# Patient Record
Sex: Female | Born: 1970 | Race: White | Hispanic: No | Marital: Married | Smoking: Never smoker
Health system: Southern US, Community
[De-identification: ages and names within clinical notes are randomized; demographics above are authoritative.]

## PROBLEM LIST (undated history)

## (undated) DIAGNOSIS — I10 Essential (primary) hypertension: Secondary | ICD-10-CM

## (undated) DIAGNOSIS — K219 Gastro-esophageal reflux disease without esophagitis: Secondary | ICD-10-CM

## (undated) DIAGNOSIS — G43909 Migraine, unspecified, not intractable, without status migrainosus: Secondary | ICD-10-CM

## (undated) DIAGNOSIS — D219 Benign neoplasm of connective and other soft tissue, unspecified: Secondary | ICD-10-CM

## (undated) DIAGNOSIS — R5383 Other fatigue: Secondary | ICD-10-CM

## (undated) HISTORY — DX: Essential (primary) hypertension: I10

## (undated) HISTORY — DX: Other fatigue: R53.83

## (undated) HISTORY — DX: Migraine, unspecified, not intractable, without status migrainosus: G43.909

## (undated) HISTORY — DX: Gastro-esophageal reflux disease without esophagitis: K21.9

## (undated) HISTORY — DX: Benign neoplasm of connective and other soft tissue, unspecified: D21.9

## (undated) HISTORY — PX: TONSILLECTOMY AND ADENOIDECTOMY: SUR1326

---
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
## 1998-07-23 ENCOUNTER — Other Ambulatory Visit: Admission: RE | Admit: 1998-07-23 | Discharge: 1998-07-23 | Payer: Self-pay | Admitting: Obstetrics & Gynecology

## 1999-02-02 ENCOUNTER — Inpatient Hospital Stay (HOSPITAL_COMMUNITY): Admission: AD | Admit: 1999-02-02 | Discharge: 1999-02-04 | Payer: Self-pay | Admitting: Obstetrics and Gynecology

## 1999-07-14 ENCOUNTER — Other Ambulatory Visit: Admission: RE | Admit: 1999-07-14 | Discharge: 1999-07-14 | Payer: Self-pay | Admitting: Obstetrics and Gynecology

## 2001-08-16 ENCOUNTER — Other Ambulatory Visit: Admission: RE | Admit: 2001-08-16 | Discharge: 2001-08-16 | Payer: Self-pay | Admitting: Obstetrics and Gynecology

## 2002-08-30 ENCOUNTER — Other Ambulatory Visit: Admission: RE | Admit: 2002-08-30 | Discharge: 2002-08-30 | Payer: Self-pay | Admitting: Obstetrics and Gynecology

## 2003-09-02 ENCOUNTER — Other Ambulatory Visit: Admission: RE | Admit: 2003-09-02 | Discharge: 2003-09-02 | Payer: Self-pay | Admitting: *Deleted

## 2004-09-23 ENCOUNTER — Other Ambulatory Visit: Admission: RE | Admit: 2004-09-23 | Discharge: 2004-09-23 | Payer: Self-pay | Admitting: Obstetrics and Gynecology

## 2005-06-28 ENCOUNTER — Ambulatory Visit: Payer: Self-pay | Admitting: Gastroenterology

## 2005-09-22 ENCOUNTER — Ambulatory Visit: Payer: Self-pay | Admitting: Internal Medicine

## 2005-10-20 ENCOUNTER — Other Ambulatory Visit: Admission: RE | Admit: 2005-10-20 | Discharge: 2005-10-20 | Payer: Self-pay | Admitting: Obstetrics and Gynecology

## 2006-04-04 ENCOUNTER — Ambulatory Visit: Payer: Self-pay | Admitting: Internal Medicine

## 2006-05-27 ENCOUNTER — Ambulatory Visit: Payer: Self-pay | Admitting: Family Medicine

## 2006-10-26 ENCOUNTER — Other Ambulatory Visit: Admission: RE | Admit: 2006-10-26 | Discharge: 2006-10-26 | Payer: Self-pay | Admitting: Obstetrics and Gynecology

## 2006-11-01 ENCOUNTER — Encounter: Admission: RE | Admit: 2006-11-01 | Discharge: 2006-11-01 | Payer: Self-pay | Admitting: Obstetrics and Gynecology

## 2007-03-16 ENCOUNTER — Ambulatory Visit: Payer: Self-pay | Admitting: Family Medicine

## 2008-01-22 ENCOUNTER — Ambulatory Visit: Payer: Self-pay | Admitting: Family Medicine

## 2008-01-22 DIAGNOSIS — T50995A Adverse effect of other drugs, medicaments and biological substances, initial encounter: Secondary | ICD-10-CM | POA: Insufficient documentation

## 2008-01-23 ENCOUNTER — Encounter (INDEPENDENT_AMBULATORY_CARE_PROVIDER_SITE_OTHER): Payer: Self-pay | Admitting: Internal Medicine

## 2008-03-22 DIAGNOSIS — G43909 Migraine, unspecified, not intractable, without status migrainosus: Secondary | ICD-10-CM | POA: Insufficient documentation

## 2008-03-22 DIAGNOSIS — I10 Essential (primary) hypertension: Secondary | ICD-10-CM | POA: Insufficient documentation

## 2008-03-22 HISTORY — DX: Migraine, unspecified, not intractable, without status migrainosus: G43.909

## 2008-03-22 HISTORY — DX: Essential (primary) hypertension: I10

## 2010-01-19 LAB — PSA: PSA: NORMAL

## 2012-06-26 ENCOUNTER — Other Ambulatory Visit: Payer: Self-pay | Admitting: Obstetrics and Gynecology

## 2012-06-26 DIAGNOSIS — Z1231 Encounter for screening mammogram for malignant neoplasm of breast: Secondary | ICD-10-CM

## 2012-07-13 ENCOUNTER — Ambulatory Visit: Payer: Self-pay

## 2012-07-24 ENCOUNTER — Ambulatory Visit: Payer: Self-pay

## 2012-08-15 ENCOUNTER — Ambulatory Visit
Admission: RE | Admit: 2012-08-15 | Discharge: 2012-08-15 | Disposition: A | Discharge status: Home / Self Care | Payer: BC Managed Care – PPO | Source: Ambulatory Visit | Attending: Obstetrics and Gynecology | Admitting: Obstetrics and Gynecology

## 2012-08-15 DIAGNOSIS — Z1231 Encounter for screening mammogram for malignant neoplasm of breast: Secondary | ICD-10-CM

## 2012-09-18 ENCOUNTER — Encounter: Payer: Self-pay | Admitting: Obstetrics and Gynecology

## 2014-02-01 ENCOUNTER — Ambulatory Visit: Payer: Self-pay | Admitting: Family Medicine

## 2014-02-01 LAB — COMPREHENSIVE METABOLIC PANEL
ANION GAP: 4 — AB (ref 7–16)
AST: 24 U/L (ref 15–37)
Albumin: 3.5 g/dL (ref 3.4–5.0)
Alkaline Phosphatase: 89 U/L
BILIRUBIN TOTAL: 0.3 mg/dL (ref 0.2–1.0)
BUN: 13 mg/dL (ref 7–18)
CO2: 28 mmol/L (ref 21–32)
Calcium, Total: 8.9 mg/dL (ref 8.5–10.1)
Chloride: 106 mmol/L (ref 98–107)
Creatinine: 0.9 mg/dL (ref 0.60–1.30)
Glucose: 93 mg/dL (ref 65–99)
Osmolality: 275 (ref 275–301)
POTASSIUM: 3.5 mmol/L (ref 3.5–5.1)
SGPT (ALT): 22 U/L (ref 12–78)
Sodium: 138 mmol/L (ref 136–145)
TOTAL PROTEIN: 7.1 g/dL (ref 6.4–8.2)

## 2014-02-01 LAB — CBC WITH DIFFERENTIAL/PLATELET
Basophil #: 0.1 10*3/uL (ref 0.0–0.1)
Basophil %: 0.7 %
EOS PCT: 2.4 %
Eosinophil #: 0.2 10*3/uL (ref 0.0–0.7)
HCT: 38 % (ref 35.0–47.0)
HGB: 13 g/dL (ref 12.0–16.0)
LYMPHS ABS: 2.3 10*3/uL (ref 1.0–3.6)
Lymphocyte %: 28.2 %
MCH: 29.3 pg (ref 26.0–34.0)
MCHC: 34.3 g/dL (ref 32.0–36.0)
MCV: 86 fL (ref 80–100)
MONOS PCT: 6 %
Monocyte #: 0.5 x10 3/mm (ref 0.2–0.9)
Neutrophil #: 5 10*3/uL (ref 1.4–6.5)
Neutrophil %: 62.7 %
Platelet: 274 10*3/uL (ref 150–440)
RBC: 4.44 10*6/uL (ref 3.80–5.20)
RDW: 13.7 % (ref 11.5–14.5)
WBC: 8 10*3/uL (ref 3.6–11.0)

## 2014-06-06 LAB — LIPID PANEL
HDL: 55 mg/dL (ref 35–70)
LDL CALC: 104 mg/dL

## 2014-06-07 LAB — LIPID PANEL
Cholesterol: 176 mg/dL (ref 0–200)
Triglycerides: 86 mg/dL (ref 40–160)

## 2015-04-17 LAB — BASIC METABOLIC PANEL
BUN: 13 mg/dL (ref 4–21)
CREATININE: 0.8 mg/dL (ref 0.5–1.1)
Glucose: 93 mg/dL
Potassium: 3.8 mmol/L (ref 3.4–5.3)
SODIUM: 141 mmol/L (ref 137–147)

## 2015-04-17 LAB — HEPATIC FUNCTION PANEL
ALT: 16 U/L (ref 7–35)
AST: 17 U/L (ref 13–35)
Alkaline Phosphatase: 87 U/L (ref 25–125)
BILIRUBIN, TOTAL: 0.2 mg/dL

## 2015-04-17 LAB — CBC AND DIFFERENTIAL
HEMATOCRIT: 37 % (ref 36–46)
HEMOGLOBIN: 12.8 g/dL (ref 12.0–16.0)
NEUTROS ABS: 5 /uL
Platelets: 333 10*3/uL (ref 150–399)
WBC: 8.3 10*3/mL

## 2015-04-18 DIAGNOSIS — D219 Benign neoplasm of connective and other soft tissue, unspecified: Secondary | ICD-10-CM | POA: Insufficient documentation

## 2015-04-18 DIAGNOSIS — R5383 Other fatigue: Secondary | ICD-10-CM

## 2015-04-18 HISTORY — DX: Other fatigue: R53.83

## 2015-04-18 HISTORY — DX: Benign neoplasm of connective and other soft tissue, unspecified: D21.9

## 2015-05-20 ENCOUNTER — Ambulatory Visit (INDEPENDENT_AMBULATORY_CARE_PROVIDER_SITE_OTHER): Payer: BC Managed Care – PPO | Admitting: Physician Assistant

## 2015-05-20 ENCOUNTER — Encounter: Payer: Self-pay | Admitting: Physician Assistant

## 2015-05-20 VITALS — BP 118/82 | HR 80 | Temp 98.4°F | Resp 16 | Wt 206.6 lb

## 2015-05-20 DIAGNOSIS — I10 Essential (primary) hypertension: Secondary | ICD-10-CM | POA: Diagnosis not present

## 2015-05-20 MED ORDER — HYDROCHLOROTHIAZIDE 12.5 MG PO TABS: 12.5000 mg | ORAL_TABLET | Freq: Every day | ORAL | Status: DC

## 2015-05-20 NOTE — Progress Notes (Deleted)
Patient ID: Rachel Wells, female   DOB: Apr 12, 1971, 44 y.o.   MRN: [id number]   Hypertension, follow-up:   BP Readings from Last 3 Encounters:  05/20/15 118/82  04/17/15 142/92  01/22/08 135/99     She was last seen for hypertension 1 months ago.   BP at that visit was 142/90.  Management changes since that visit include added HCTZ 12.20m daily. She reports good compliance with treatment.  She is not having side effects.   She is not exercising.  She is not adherent to low salt diet.    Outside blood pressures are not being taken.  She is experiencing lower extremity edema.   Patient denies chest pain, chest pressure/discomfort, claudication, dyspnea, exertional chest pressure/discomfort, irregular heart beat, near-syncope, orthopnea, palpitations, paroxysmal nocturnal dyspnea, syncope and tachypnea.    Cardiovascular risk factors include hypertension, obesity (BMI >= 30 kg/m2) and sedentary lifestyle.   Use of agents associated with hypertension: none.    Weight trend: decreasing steadily Current diet: in general, a "healthy" diet

## 2015-05-20 NOTE — Patient Instructions (Signed)
Fat and Cholesterol Control Diet Fat and cholesterol levels in your blood and organs are influenced by your diet. High levels of fat and cholesterol may lead to diseases of the heart, small and large blood vessels, gallbladder, liver, and pancreas. CONTROLLING FAT AND CHOLESTEROL WITH DIET Although exercise and lifestyle factors are important, your diet is key. That is because certain foods are known to raise cholesterol and others to lower it. The goal is to balance foods for their effect on cholesterol and more importantly, to replace saturated and trans fat with other types of fat, such as monounsaturated fat, polyunsaturated fat, and omega-3 fatty acids. On average, a person should consume no more than 15 to 17 g of saturated fat daily. Saturated and trans fats are considered "bad" fats, and they will raise LDL cholesterol. Saturated fats are primarily found in animal products such as meats, butter, and cream. However, that does not mean you need to give up all your favorite foods. Today, there are good tasting, low-fat, low-cholesterol substitutes for most of the things you like to eat. Choose low-fat or nonfat alternatives. Choose round or loin cuts of red meat. These types of cuts are lowest in fat and cholesterol. Chicken (without the skin), fish, veal, and ground turkey breast are great choices. Eliminate fatty meats, such as hot dogs and salami. Even shellfish have little or no saturated fat. Have a 3 oz (85 g) portion when you eat lean meat, poultry, or fish. Trans fats are also called "partially hydrogenated oils." They are oils that have been scientifically manipulated so that they are solid at room temperature resulting in a longer shelf life and improved taste and texture of foods in which they are added. Trans fats are found in stick margarine, some tub margarines, cookies, crackers, and baked goods.  When baking and cooking, oils are a great substitute for butter. The monounsaturated oils are  especially beneficial since it is believed they lower LDL and raise HDL. The oils you should avoid entirely are saturated tropical oils, such as coconut and palm.  Remember to eat a lot from food groups that are naturally free of saturated and trans fat, including fish, fruit, vegetables, beans, grains (barley, rice, couscous, bulgur wheat), and pasta (without cream sauces).  IDENTIFYING FOODS THAT LOWER FAT AND CHOLESTEROL  Soluble fiber may lower your cholesterol. This type of fiber is found in fruits such as apples, vegetables such as broccoli, potatoes, and carrots, legumes such as beans, peas, and lentils, and grains such as barley. Foods fortified with plant sterols (phytosterol) may also lower cholesterol. You should eat at least 2 g per day of these foods for a cholesterol lowering effect.  Read package labels to identify low-saturated fats, trans fat free, and low-fat foods at the supermarket. Select cheeses that have only 2 to 3 g saturated fat per ounce. Use a heart-healthy tub margarine that is free of trans fats or partially hydrogenated oil. When buying baked goods (cookies, crackers), avoid partially hydrogenated oils. Breads and muffins should be made from whole grains (whole-wheat or whole oat flour, instead of "flour" or "enriched flour"). Buy non-creamy canned soups with reduced salt and no added fats.  FOOD PREPARATION TECHNIQUES  Never deep-fry. If you must fry, either stir-fry, which uses very little fat, or use non-stick cooking sprays. When possible, broil, bake, or roast meats, and steam vegetables. Instead of putting butter or margarine on vegetables, use lemon and herbs, applesauce, and cinnamon (for squash and sweet potatoes). Use nonfat   yogurt, salsa, and low-fat dressings for salads.  LOW-SATURATED FAT / LOW-FAT FOOD SUBSTITUTES Meats / Saturated Fat (g)  Avoid: Steak, marbled (3 oz/85 g) / 11 g  Choose: Steak, lean (3 oz/85 g) / 4 g  Avoid: Hamburger (3 oz/85 g) / 7  g  Choose: Hamburger, lean (3 oz/85 g) / 5 g  Avoid: Ham (3 oz/85 g) / 6 g  Choose: Ham, lean cut (3 oz/85 g) / 2.4 g  Avoid: Chicken, with skin, dark meat (3 oz/85 g) / 4 g  Choose: Chicken, skin removed, dark meat (3 oz/85 g) / 2 g  Avoid: Chicken, with skin, light meat (3 oz/85 g) / 2.5 g  Choose: Chicken, skin removed, light meat (3 oz/85 g) / 1 g Dairy / Saturated Fat (g)  Avoid: Whole milk (1 cup) / 5 g  Choose: Low-fat milk, 2% (1 cup) / 3 g  Choose: Low-fat milk, 1% (1 cup) / 1.5 g  Choose: Skim milk (1 cup) / 0.3 g  Avoid: Hard cheese (1 oz/28 g) / 6 g  Choose: Skim milk cheese (1 oz/28 g) / 2 to 3 g  Avoid: Cottage cheese, 4% fat (1 cup) / 6.5 g  Choose: Low-fat cottage cheese, 1% fat (1 cup) / 1.5 g  Avoid: Ice cream (1 cup) / 9 g  Choose: Sherbet (1 cup) / 2.5 g  Choose: Nonfat frozen yogurt (1 cup) / 0.3 g  Choose: Frozen fruit bar / trace  Avoid: Whipped cream (1 tbs) / 3.5 g  Choose: Nondairy whipped topping (1 tbs) / 1 g Condiments / Saturated Fat (g)  Avoid: Mayonnaise (1 tbs) / 2 g  Choose: Low-fat mayonnaise (1 tbs) / 1 g  Avoid: Butter (1 tbs) / 7 g  Choose: Extra light margarine (1 tbs) / 1 g  Avoid: Coconut oil (1 tbs) / 11.8 g  Choose: Olive oil (1 tbs) / 1.8 g  Choose: Corn oil (1 tbs) / 1.7 g  Choose: Safflower oil (1 tbs) / 1.2 g  Choose: Sunflower oil (1 tbs) / 1.4 g  Choose: Soybean oil (1 tbs) / 2.4 g  Choose: Canola oil (1 tbs) / 1 g Document Released: 11/29/2005 Document Revised: 03/26/2013 Document Reviewed: 02/27/2014 [employer] Patient Information 2015 [place], [place]. This information is not intended to replace advice given to you by your health care provider. Make sure you discuss any questions you have with your health care provider.  American Heart Association (AHA) Exercise Recommendation  Being physically active is important to prevent heart disease and stroke, the nation's No. 1and No. 5killers. To improve  overall cardiovascular health, we suggest at least 150 minutes per week of moderate exercise or 75 minutes per week of vigorous exercise (or a combination of moderate and vigorous activity). Thirty minutes a day, five times a week is an easy goal to remember. You will also experience benefits even if you divide your time into two or three segments of 10 to 15 minutes per day.  For people who would benefit from lowering their blood pressure or cholesterol, we recommend 40 minutes of aerobic exercise of moderate to vigorous intensity three to four times a week to lower the risk for heart attack and stroke.  Physical activity is anything that makes you move your body and burn calories.  This includes things like climbing stairs or playing sports. Aerobic exercises benefit your heart, and include walking, jogging, swimming or biking. Strength and stretching exercises are best for overall stamina  and flexibility.  The simplest, positive change you can make to effectively improve your heart health is to start walking. It's enjoyable, free, easy, social and great exercise. A walking program is flexible and boasts high success rates because people can stick with it. It's easy for walking to become a regular and satisfying part of life.   For Overall Cardiovascular Health:  At least 30 minutes of moderate-intensity aerobic activity at least 5 days per week for a total of 150  OR   At least 25 minutes of vigorous aerobic activity at least 3 days per week for a total of 75 minutes; or a combination of moderate- and vigorous-intensity aerobic activity  AND   Moderate- to high-intensity muscle-strengthening activity at least 2 days per week for additional health benefits.  For Lowering Blood Pressure and Cholesterol  An average 40 minutes of moderate- to vigorous-intensity aerobic activity 3 or 4 times per week  What if I can't make it to the time goal? Something is always better than nothing! And  everyone has to start somewhere. Even if you've been sedentary for years, today is the day you can begin to make healthy changes in your life. If you don't think you'll make it for 30 or 40 minutes, set a reachable goal for today. You can work up toward your overall goal by increasing your time as you get stronger. Don't let all-or-nothing thinking rob you of doing what you can every day.  Source:http://www.heart.org   Hypertension Hypertension, commonly called high blood pressure, is when the force of blood pumping through your arteries is too strong. Your arteries are the blood vessels that carry blood from your heart throughout your body. A blood pressure reading consists of a higher number over a lower number, such as 110/72. The higher number (systolic) is the pressure inside your arteries when your heart pumps. The lower number (diastolic) is the pressure inside your arteries when your heart relaxes. Ideally you want your blood pressure below 120/80. Hypertension forces your heart to work harder to pump blood. Your arteries may become narrow or stiff. Having hypertension puts you at risk for heart disease, stroke, and other problems.  RISK FACTORS Some risk factors for high blood pressure are controllable. Others are not.  Risk factors you cannot control include:   Race. You may be at higher risk if you are African American.  Age. Risk increases with age.  Gender. Men are at higher risk than women before age 71 years. After age 47, women are at higher risk than men. Risk factors you can control include:  Not getting enough exercise or physical activity.  Being overweight.  Getting too much fat, sugar, calories, or salt in your diet.  Drinking too much alcohol. SIGNS AND SYMPTOMS Hypertension does not usually cause signs or symptoms. Extremely high blood pressure (hypertensive crisis) may cause headache, anxiety, shortness of breath, and nosebleed. DIAGNOSIS  To check if you have  hypertension, your health care provider will measure your blood pressure while you are seated, with your arm held at the level of your heart. It should be measured at least twice using the same arm. Certain conditions can cause a difference in blood pressure between your right and left arms. A blood pressure reading that is higher than normal on one occasion does not mean that you need treatment. If one blood pressure reading is high, ask your health care provider about having it checked again. TREATMENT  Treating high blood pressure includes making lifestyle  changes and possibly taking medicine. Living a healthy lifestyle can help lower high blood pressure. You may need to change some of your habits. Lifestyle changes may include:  Following the DASH diet. This diet is high in fruits, vegetables, and whole grains. It is low in salt, red meat, and added sugars.  Getting at least 2 hours of brisk physical activity every week.  Losing weight if necessary.  Not smoking.  Limiting alcoholic beverages.  Learning ways to reduce stress. If lifestyle changes are not enough to get your blood pressure under control, your health care provider may prescribe medicine. You may need to take more than one. Work closely with your health care provider to understand the risks and benefits. HOME CARE INSTRUCTIONS  Have your blood pressure rechecked as directed by your health care provider.   Take medicines only as directed by your health care provider. Follow the directions carefully. Blood pressure medicines must be taken as prescribed. The medicine does not work as well when you skip doses. Skipping doses also puts you at risk for problems.   Do not smoke.   Monitor your blood pressure at home as directed by your health care provider. SEEK MEDICAL CARE IF:   You think you are having a reaction to medicines taken.  You have recurrent headaches or feel dizzy.  You have swelling in your  ankles.  You have trouble with your vision. SEEK IMMEDIATE MEDICAL CARE IF:  You develop a severe headache or confusion.  You have unusual weakness, numbness, or feel faint.  You have severe chest or abdominal pain.  You vomit repeatedly.  You have trouble breathing. MAKE SURE YOU:   Understand these instructions.  Will watch your condition.  Will get help right away if you are not doing well or get worse. Document Released: 11/29/2005 Document Revised: 04/15/2014 Document Reviewed: 09/21/2013 [facility] Patient Information 2015 [place], [place]. This information is not intended to replace advice given to you by your health care provider. Make sure you discuss any questions you have with your health care provider.

## 2015-05-20 NOTE — Progress Notes (Signed)
Subjective:     Patient ID: Rachel Wells, female   DOB: March 24, 1971, 44 y.o.   MRN: [id number]  HPI   Hypertension, follow-up:   BP Readings from Last 3 Encounters:  05/20/15 118/82  04/17/15 142/92  01/22/08 135/99     She was last seen for hypertension 1 months ago.   BP at that visit was 142/92.  Management changes since that visit include adding HCTZ 12.5 mg daily and continuing Metoprolol 13m daily. She reports good compliance with treatment.  She is having side effects. Lower extremity edema.  She is not exercising.  She is not adherent to low salt diet.    Outside blood pressures are not being checked.  She is experiencing lower extremity edema.   Patient denies chest pain, chest pressure/discomfort, claudication, dyspnea, exertional chest pressure/discomfort, fatigue, irregular heart beat, near-syncope, orthopnea, palpitations, paroxysmal nocturnal dyspnea, syncope and tachypnea.    Cardiovascular risk factors include hypertension, obesity (BMI >= 30 kg/m2) and sedentary lifestyle.   Use of agents associated with hypertension: none.    Weight trend: stable Current diet: in general, a "healthy" diet        Review of Systems  Constitutional: Positive for fatigue. Negative for fever, chills, diaphoresis, activity change, appetite change and unexpected weight change.  HENT: Negative for trouble swallowing.   Eyes: Negative for visual disturbance.  Respiratory: Negative for cough, choking, chest tightness, shortness of breath and wheezing.   Cardiovascular: Positive for leg swelling (bilateral lower extremities). Negative for chest pain and palpitations.  Gastrointestinal: Negative for nausea, vomiting, abdominal pain, diarrhea and constipation.  Endocrine: Negative for cold intolerance, heat intolerance, polydipsia, polyphagia and polyuria.  Musculoskeletal: Negative for myalgias, back pain, joint swelling, arthralgias, gait problem, neck pain and neck stiffness.  Skin:  Negative for color change and rash.  Neurological: Negative for dizziness, syncope, weakness, light-headedness, numbness and headaches.  Hematological: Negative for adenopathy. Does not bruise/bleed easily.  Psychiatric/Behavioral: Negative for decreased concentration and agitation. The patient is not nervous/anxious.        Objective:   Physical Exam  Constitutional: She appears well-developed and well-nourished. No distress.  Cardiovascular: Normal rate, regular rhythm, normal heart sounds and intact distal pulses.  Exam reveals no gallop and no friction rub.   No murmur heard. Pulmonary/Chest: Effort normal and breath sounds normal. No respiratory distress. She has no wheezes. She has no rales. She exhibits no tenderness.  Musculoskeletal: She exhibits edema (bilateral lower extremities with trace edema, non-pitting).  Neurological: She is alert.  Skin: Skin is warm and dry. She is not diaphoretic.  Psychiatric: She has a normal mood and affect. Her behavior is normal. Judgment and thought content normal.  Vitals reviewed.      Assessment:     Primary Diagnosis: Essential hypertension [I10]       Plan:     1. Essential hypertension Improving.  Will recheck labs since there is some lower extremity edema.  F/U pending labs.  If labs are stable will recheck in 6 months.  - CBC w/Diff - Basic Metabolic Panel (BMET) - hydrochlorothiazide (HYDRODIURIL) 12.5 MG tablet; Take 1 tablet (12.5 mg total) by mouth daily.  Dispense: 30 tablet; Refill: 6

## 2015-05-21 ENCOUNTER — Telehealth: Payer: Self-pay

## 2015-05-21 LAB — BASIC METABOLIC PANEL
BUN/Creatinine Ratio: 12 (ref 9–23)
BUN: 11 mg/dL (ref 6–24)
CHLORIDE: 101 mmol/L (ref 97–108)
CO2: 24 mmol/L (ref 18–29)
Calcium: 8.8 mg/dL (ref 8.7–10.2)
Creatinine, Ser: 0.91 mg/dL (ref 0.57–1.00)
GFR calc Af Amer: 89 mL/min/{1.73_m2} (ref >59)
GFR calc non Af Amer: 78 mL/min/{1.73_m2} (ref >59)
Glucose: 88 mg/dL (ref 65–99)
POTASSIUM: 3.5 mmol/L (ref 3.5–5.2)
SODIUM: 141 mmol/L (ref 134–144)

## 2015-05-21 LAB — CBC WITH DIFFERENTIAL/PLATELET
BASOS ABS: 0 10*3/uL (ref 0.0–0.2)
Basos: 1 %
EOS (ABSOLUTE): 0.2 10*3/uL (ref 0.0–0.4)
Eos: 3 %
HEMATOCRIT: 36.3 % (ref 34.0–46.6)
HEMOGLOBIN: 12.5 g/dL (ref 11.1–15.9)
IMMATURE GRANULOCYTES: 0 %
Immature Grans (Abs): 0 10*3/uL (ref 0.0–0.1)
Lymphocytes Absolute: 2 10*3/uL (ref 0.7–3.1)
Lymphs: 27 %
MCH: 28.3 pg (ref 26.6–33.0)
MCHC: 34.4 g/dL (ref 31.5–35.7)
MCV: 82 fL (ref 79–97)
MONOS ABS: 0.8 10*3/uL (ref 0.1–0.9)
Monocytes: 11 %
Neutrophils Absolute: 4.3 10*3/uL (ref 1.4–7.0)
Neutrophils: 58 %
PLATELETS: 310 10*3/uL (ref 150–379)
RBC: 4.41 x10E6/uL (ref 3.77–5.28)
RDW: 14.5 % (ref 12.3–15.4)
WBC: 7.3 10*3/uL (ref 3.4–10.8)

## 2015-05-21 NOTE — Telephone Encounter (Signed)
Patient advised as directed below.

## 2015-05-21 NOTE — Telephone Encounter (Signed)
-----   Message from Mar Daring, PA-C sent at 05/21/2015  8:32 AM EDT ----- All labs are within normal limits and stable.  Thanks! -JB

## 2015-06-19 ENCOUNTER — Other Ambulatory Visit: Payer: Self-pay | Admitting: Physician Assistant

## 2015-08-22 ENCOUNTER — Encounter: Payer: Self-pay | Admitting: Physician Assistant

## 2015-08-22 ENCOUNTER — Ambulatory Visit (INDEPENDENT_AMBULATORY_CARE_PROVIDER_SITE_OTHER): Payer: BC Managed Care – PPO | Admitting: Physician Assistant

## 2015-08-22 VITALS — BP 130/80 | HR 79 | Temp 97.9°F | Resp 16 | Wt 210.8 lb

## 2015-08-22 DIAGNOSIS — H6123 Impacted cerumen, bilateral: Secondary | ICD-10-CM

## 2015-08-22 DIAGNOSIS — R42 Dizziness and giddiness: Secondary | ICD-10-CM

## 2015-08-22 MED ORDER — MECLIZINE HCL 25 MG PO TABS: 25.0000 mg | ORAL_TABLET | Freq: Three times a day (TID) | ORAL | Status: DC | PRN

## 2015-08-22 NOTE — Patient Instructions (Signed)
Cerumen Impaction A cerumen impaction is when the wax in your ear forms a plug. This plug usually causes reduced hearing. Sometimes it also causes an earache or dizziness. Removing a cerumen impaction can be difficult and painful. The wax sticks to the ear canal. The canal is sensitive and bleeds easily. If you try to remove a heavy wax buildup with a cotton tipped swab, you may push it in further. Irrigation with water, suction, and small ear curettes may be used to clear out the wax. If the impaction is fixed to the skin in the ear canal, ear drops may be needed for a few days to loosen the wax. People who build up a lot of wax frequently can use ear wax removal products available in your local drugstore. SEEK MEDICAL CARE IF:  You develop an earache, increased hearing loss, or marked dizziness. Document Released: 01/06/2005 Document Revised: 02/21/2012 Document Reviewed: 02/26/2010 [facility] Patient Information 2015 [place], [place]. This information is not intended to replace advice given to you by your health care provider. Make sure you discuss any questions you have with your health care provider.  Carbamide Peroxide ear solution What is this medicine? CARBAMIDE PEROXIDE (CAR bah mide per OX ide) is used to soften and help remove ear wax. This medicine may be used for other purposes; ask your health care provider or pharmacist if you have questions. COMMON BRAND NAME(S): Auro Ear, Auro Earache Relief, Debrox, Ear Drops, Ear Wax Removal, Ear Wax Remover, Earwax Treatment, Murine, Thera-Ear What should I tell my health care provider before I take this medicine? They need to know if you have any of these conditions: -dizziness -ear discharge -ear pain, irritation or rash -infection -perforated eardrum (hole in eardrum) -an unusual or allergic reaction to carbamide peroxide, glycerin, hydrogen peroxide, other medicines, foods, dyes, or preservatives -pregnant or trying to get  pregnant -breast-feeding How should I use this medicine? This medicine is only for use in the outer ear canal. Follow the directions carefully. Wash hands before and after use. The solution may be warmed by holding the bottle in the hand for 1 to 2 minutes. Lie with the affected ear facing upward. Place the proper number of drops into the ear canal. After the drops are instilled, remain lying with the affected ear upward for 5 minutes to help the drops stay in the ear canal. A cotton ball may be gently inserted at the ear opening for no longer than 5 to 10 minutes to ensure retention. Repeat, if necessary, for the opposite ear. Do not touch the tip of the dropper to the ear, fingertips, or other surface. Do not rinse the dropper after use. Keep container tightly closed. Talk to your pediatrician regarding the use of this medicine in children. While this drug may be used in children as young as 12 years for selected conditions, precautions do apply. Overdosage: If you think you have taken too much of this medicine contact a poison control center or emergency room at once. NOTE: This medicine is only for you. Do not share this medicine with others. What if I miss a dose? If you miss a dose, use it as soon as you can. If it is almost time for your next dose, use only that dose. Do not use double or extra doses. What may interact with this medicine? Interactions are not expected. Do not use any other ear products without asking your doctor or health care professional. This list may not describe all possible interactions. Give  your health care provider a list of all the medicines, herbs, non-prescription drugs, or dietary supplements you use. Also tell them if you smoke, drink alcohol, or use illegal drugs. Some items may interact with your medicine. What should I watch for while using this medicine? This medicine is not for long-term use. Do not use for more than 4 days without checking with your health  care professional. Contact your doctor or health care professional if your condition does not start to get better within a few days or if you notice burning, redness, itching or swelling. What side effects may I notice from receiving this medicine? Side effects that you should report to your doctor or health care professional as soon as possible: -allergic reactions like skin rash, itching or hives, swelling of the face, lips, or tongue -burning, itching, and redness -worsening ear pain -rash Side effects that usually do not require medical attention (report to your doctor or health care professional if they continue or are bothersome): -abnormal sensation while putting the drops in the ear -temporary reduction in hearing (but not complete loss of hearing) This list may not describe all possible side effects. Call your doctor for medical advice about side effects. You may report side effects to FDA at 1-800-FDA-1088. Where should I keep my medicine? Keep out of the reach of children. Store at room temperature between 15 and 30 degrees C (59 and 86 degrees F) in a tight, light-resistant container. Keep bottle away from excessive heat and direct sunlight. Throw away any unused medicine after the expiration date. NOTE: This sheet is a summary. It may not cover all possible information. If you have questions about this medicine, talk to your doctor, pharmacist, or health care provider.  2015, [employer]. (2008-03-12 14:00:02)  Vertigo Vertigo means you feel like you or your surroundings are moving when they are not. Vertigo can be dangerous if it occurs when you are at work, driving, or performing difficult activities.  CAUSES  Vertigo occurs when there is a conflict of signals sent to your brain from the visual and sensory systems in your body. There are many different causes of vertigo, including:  Infections, especially in the inner ear.  A bad reaction to a drug or misuse of  alcohol and medicines.  Withdrawal from drugs or alcohol.  Rapidly changing positions, such as lying down or rolling over in bed.  A migraine headache.  Decreased blood flow to the brain.  Increased pressure in the brain from a head injury, infection, tumor, or bleeding. SYMPTOMS  You may feel as though the world is spinning around or you are falling to the ground. Because your balance is upset, vertigo can cause nausea and vomiting. You may have involuntary eye movements (nystagmus). DIAGNOSIS  Vertigo is usually diagnosed by physical exam. If the cause of your vertigo is unknown, your caregiver may perform imaging tests, such as an MRI scan (magnetic resonance imaging). TREATMENT  Most cases of vertigo resolve on their own, without treatment. Depending on the cause, your caregiver may prescribe certain medicines. If your vertigo is related to body position issues, your caregiver may recommend movements or procedures to correct the problem. In rare cases, if your vertigo is caused by certain inner ear problems, you may need surgery. HOME CARE INSTRUCTIONS   Follow your caregiver's instructions.  Avoid driving.  Avoid operating heavy machinery.  Avoid performing any tasks that would be dangerous to you or others during a vertigo episode.  Tell your caregiver  if you notice that certain medicines seem to be causing your vertigo. Some of the medicines used to treat vertigo episodes can actually make them worse in some people. SEEK IMMEDIATE MEDICAL CARE IF:   Your medicines do not relieve your vertigo or are making it worse.  You develop problems with talking, walking, weakness, or using your arms, hands, or legs.  You develop severe headaches.  Your nausea or vomiting continues or gets worse.  You develop visual changes.  A family member notices behavioral changes.  Your condition gets worse. MAKE SURE YOU:  Understand these instructions.  Will watch your  condition.  Will get help right away if you are not doing well or get worse. Document Released: 09/08/2005 Document Revised: 02/21/2012 Document Reviewed: 06/17/2011 [facility] Patient Information 2015 [place], [place]. This information is not intended to replace advice given to you by your health care provider. Make sure you discuss any questions you have with your health care provider.

## 2015-08-22 NOTE — Progress Notes (Signed)
Patient: Rachel Wells Female    DOB: 1971-11-22   44 y.o.   MRN: [id number] Visit Date: 08/22/2015  Today's Provider: Mar Daring, PA-C   Chief Complaint  Patient presents with  . Dizziness   Subjective:    Dizziness This is a new problem. The current episode started today (This morning). The problem occurs intermittently (With movement). The problem has been unchanged. Associated symptoms include nausea. Pertinent negatives include no chest pain, congestion, coughing, fatigue, fever, headaches, neck pain, numbness, vertigo or weakness. The symptoms are aggravated by bending and walking (with walking sometimes). She has tried drinking and lying down (Ibuprofen) for the symptoms. The treatment provided no relief.  She has had no fevers, chills, nausea or vomiting.  She also denies tinnitus.She does take certirizine daily for allergies.     Allergies  Allergen Reactions  . Neomycin-Bacitracin Zn-Polymyx     REACTION: rash, itch  . Shrimp [Shellfish Allergy] Swelling   Previous Medications   CETIRIZINE HCL 10 MG CAPS    Take 1 tablet by mouth daily.   HYDROCHLOROTHIAZIDE (HYDRODIURIL) 12.5 MG TABLET    Take 1 tablet (12.5 mg total) by mouth daily.   METOPROLOL SUCCINATE (TOPROL-XL) 50 MG 24 HR TABLET    TAKE 1 TABLET BY MOUTH DAILY   MULTIPLE VITAMIN PO    Take 1 tablet by mouth daily.    Review of Systems  Constitutional: Negative.  Negative for fever and fatigue.  HENT: Negative.  Negative for congestion.        Has pressure on her ears also and feels worst on Left ears. This also started today   Eyes: Negative.   Respiratory: Negative.  Negative for cough.   Cardiovascular: Negative.  Negative for chest pain and palpitations.  Gastrointestinal: Positive for nausea.  Endocrine: Negative.   Genitourinary: Negative.   Musculoskeletal: Negative.  Negative for neck pain.  Skin: Negative.   Allergic/Immunologic: Negative.   Neurological: Positive for dizziness.  Negative for vertigo, weakness, light-headedness, numbness and headaches.  Hematological: Negative.   Psychiatric/Behavioral: Negative.     Social History  Substance Use Topics  . Smoking status: Never Smoker   . Smokeless tobacco: Not on file  . Alcohol Use: No   Objective:   BP 130/80 mmHg  Pulse 79  Temp(Src) 97.9 F (36.6 C) (Oral)  Resp 16  Wt 210 lb 12.8 oz (95.618 kg)  SpO2 98%  LMP 08/03/2015  Physical Exam  Constitutional: She is oriented to person, place, and time. She appears well-developed and well-nourished. No distress.  HENT:  Head: Normocephalic and atraumatic.  Right Ear: Hearing, tympanic membrane, external ear and ear canal normal.  Left Ear: Hearing, external ear and ear canal normal. Tympanic membrane is not perforated, not erythematous, not retracted and not bulging. A middle ear effusion (air bubbles noted behind TM; no bulge, no erythema) is present.  Nose: Nose normal. Right sinus exhibits no maxillary sinus tenderness and no frontal sinus tenderness. Left sinus exhibits no maxillary sinus tenderness and no frontal sinus tenderness.  Mouth/Throat: Uvula is midline, oropharynx is clear and moist and mucous membranes are normal. No oropharyngeal exudate.  Initial exam showed bilateral cerumen impaction with the left > right.    Eyes: Conjunctivae and EOM are normal. Pupils are equal, round, and reactive to light. Right eye exhibits no discharge. Left eye exhibits no discharge. No scleral icterus. Right eye exhibits no nystagmus. Left eye exhibits no nystagmus.  Neck: Normal range of  motion. Neck supple. No tracheal deviation present. No thyromegaly present.  Cardiovascular: Normal rate, regular rhythm and normal heart sounds.  Exam reveals no gallop and no friction rub.   No murmur heard. Pulmonary/Chest: Effort normal and breath sounds normal. No stridor. No respiratory distress. She has no wheezes. She has no rales.  Lymphadenopathy:    She has no cervical  adenopathy.  Neurological: She is alert and oriented to person, place, and time. She has normal strength. No cranial nerve deficit or sensory deficit. She displays a negative Romberg sign. Coordination and gait normal.  Skin: Skin is warm and dry. She is not diaphoretic.  Vitals reviewed.       Assessment & Plan:     1. Dizziness Most likely secondary to cerumen impaction and middle ear effusion behind left TM.  Advised to start meclizine prn for dizziness and coricidin hbp for decongestion (HTN).  I did advise her to call next week if the dizziness persists or worsens.  - meclizine (ANTIVERT) 25 MG tablet; Take 1 tablet (25 mg total) by mouth 3 (three) times daily as needed for dizziness.  Dispense: 30 tablet; Refill: 0  2. Cerumen impaction, bilateral Cerumen removed bilaterally today in the office without complication.  Advised for her to try OTC Debrox, or other ear cleaning solutions to prevent future build up.  She voices understanding and agrees. - Ear cerumen removal      Mar Daring, PA-C  [facility]

## 2015-08-23 ENCOUNTER — Telehealth: Payer: Self-pay | Admitting: Physician Assistant

## 2015-08-23 DIAGNOSIS — H9209 Otalgia, unspecified ear: Secondary | ICD-10-CM | POA: Insufficient documentation

## 2015-08-23 DIAGNOSIS — H9202 Otalgia, left ear: Secondary | ICD-10-CM

## 2015-08-23 MED ORDER — AMOXICILLIN 500 MG PO CAPS: 500.0000 mg | ORAL_CAPSULE | Freq: Three times a day (TID) | ORAL | Status: DC

## 2015-08-23 NOTE — Telephone Encounter (Signed)
Pt advised, rx for Amoxil sent to [facility] [address].   Thanks,   -Mickel Baas

## 2015-08-23 NOTE — Telephone Encounter (Signed)
Pt states she was here yesterday to see Tawanna Sat for dizziness.  Tawanna Sat did a ear wash.  Pt states both ears are now stopped up and having ear pain in the left ear.  Pt is requesting a Rx to help with this.  [facility].  [phone]

## 2015-08-23 NOTE — Telephone Encounter (Signed)
Amoxil --if pcn allergic Zpajk

## 2015-12-02 ENCOUNTER — Other Ambulatory Visit: Payer: Self-pay | Admitting: Physician Assistant

## 2015-12-02 DIAGNOSIS — I10 Essential (primary) hypertension: Secondary | ICD-10-CM

## 2015-12-11 ENCOUNTER — Ambulatory Visit (INDEPENDENT_AMBULATORY_CARE_PROVIDER_SITE_OTHER): Payer: BC Managed Care – PPO | Admitting: Physician Assistant

## 2015-12-11 ENCOUNTER — Encounter: Payer: Self-pay | Admitting: Physician Assistant

## 2015-12-11 VITALS — BP 140/82 | HR 68 | Temp 98.6°F | Resp 12 | Wt 215.0 lb

## 2015-12-11 DIAGNOSIS — I10 Essential (primary) hypertension: Secondary | ICD-10-CM | POA: Diagnosis not present

## 2015-12-11 DIAGNOSIS — R635 Abnormal weight gain: Secondary | ICD-10-CM | POA: Diagnosis not present

## 2015-12-11 MED ORDER — HYDROCHLOROTHIAZIDE 25 MG PO TABS: 25.0000 mg | ORAL_TABLET | Freq: Every day | ORAL | Status: DC

## 2015-12-11 NOTE — Patient Instructions (Addendum)
Hypertension Hypertension, commonly called high blood pressure, is when the force of blood pumping through your arteries is too strong. Your arteries are the blood vessels that carry blood from your heart throughout your body. A blood pressure reading consists of a higher number over a lower number, such as 110/72. The higher number (systolic) is the pressure inside your arteries when your heart pumps. The lower number (diastolic) is the pressure inside your arteries when your heart relaxes. Ideally you want your blood pressure below 120/80. Hypertension forces your heart to work harder to pump blood. Your arteries may become narrow or stiff. Having untreated or uncontrolled hypertension can cause heart attack, stroke, kidney disease, and other problems. RISK FACTORS Some risk factors for high blood pressure are controllable. Others are not.  Risk factors you cannot control include:   Race. You may be at higher risk if you are African American.  Age. Risk increases with age.  Gender. Men are at higher risk than women before age 15 years. After age 44, women are at higher risk than men. Risk factors you can control include:  Not getting enough exercise or physical activity.  Being overweight.  Getting too much fat, sugar, calories, or salt in your diet.  Drinking too much alcohol. SIGNS AND SYMPTOMS Hypertension does not usually cause signs or symptoms. Extremely high blood pressure (hypertensive crisis) may cause headache, anxiety, shortness of breath, and nosebleed. DIAGNOSIS To check if you have hypertension, your health care provider will measure your blood pressure while you are seated, with your arm held at the level of your heart. It should be measured at least twice using the same arm. Certain conditions can cause a difference in blood pressure between your right and left arms. A blood pressure reading that is higher than normal on one occasion does not mean that you need treatment.  If it is not clear whether you have high blood pressure, you may be asked to return on a different day to have your blood pressure checked again. Or, you may be asked to monitor your blood pressure at home for 1 or more weeks. TREATMENT Treating high blood pressure includes making lifestyle changes and possibly taking medicine. Living a healthy lifestyle can help lower high blood pressure. You may need to change some of your habits. Lifestyle changes may include:  Following the DASH diet. This diet is high in fruits, vegetables, and whole grains. It is low in salt, red meat, and added sugars.  Keep your sodium intake below 2,300 mg per day.  Getting at least 30-45 minutes of aerobic exercise at least 4 times per week.  Losing weight if necessary.  Not smoking.  Limiting alcoholic beverages.  Learning ways to reduce stress. Your health care provider may prescribe medicine if lifestyle changes are not enough to get your blood pressure under control, and if one of the following is true:  You are 53-66 years of age and your systolic blood pressure is above 140.  You are 87 years of age or older, and your systolic blood pressure is above 150.  Your diastolic blood pressure is above 90.  You have diabetes, and your systolic blood pressure is over 299 or your diastolic blood pressure is over 90.  You have kidney disease and your blood pressure is above 140/90.  You have heart disease and your blood pressure is above 140/90. Your personal target blood pressure may vary depending on your medical conditions, your age, and other factors. HOME CARE INSTRUCTIONS  Have your blood pressure rechecked as directed by your health care provider.   Take medicines only as directed by your health care provider. Follow the directions carefully. Blood pressure medicines must be taken as prescribed. The medicine does not work as well when you skip doses. Skipping doses also puts you at risk for  problems.  Do not smoke.   Monitor your blood pressure at home as directed by your health care provider. SEEK MEDICAL CARE IF:   You think you are having a reaction to medicines taken.  You have recurrent headaches or feel dizzy.  You have swelling in your ankles.  You have trouble with your vision. SEEK IMMEDIATE MEDICAL CARE IF:  You develop a severe headache or confusion.  You have unusual weakness, numbness, or feel faint.  You have severe chest or abdominal pain.  You vomit repeatedly.  You have trouble breathing. MAKE SURE YOU:   Understand these instructions.  Will watch your condition.  Will get help right away if you are not doing well or get worse.   This information is not intended to replace advice given to you by your health care provider. Make sure you discuss any questions you have with your health care provider.   Document Released: 11/29/2005 Document Revised: 04/15/2015 Document Reviewed: 09/21/2013 Elsevier Interactive Patient Education 2016 [place] DASH stands for "Dietary Approaches to Stop Hypertension." The DASH eating plan is a healthy eating plan that has been shown to reduce high blood pressure (hypertension). Additional health benefits may include reducing the risk of type 2 diabetes mellitus, heart disease, and stroke. The DASH eating plan may also help with weight loss. WHAT DO I NEED TO KNOW ABOUT THE DASH EATING PLAN? For the DASH eating plan, you will follow these general guidelines:  Choose foods with a percent daily value for sodium of less than 5% (as listed on the food label).  Use salt-free seasonings or herbs instead of table salt or sea salt.  Check with your health care provider or pharmacist before using salt substitutes.  Eat lower-sodium products, often labeled as "lower sodium" or "no salt added."  Eat fresh foods.  Eat more vegetables, fruits, and low-fat dairy products.  Choose whole  grains. Look for the word "whole" as the first word in the ingredient list.  Choose fish and skinless chicken or [place] more often than red meat. Limit fish, poultry, and meat to 6 oz (170 g) each day.  Limit sweets, desserts, sugars, and sugary drinks.  Choose heart-healthy fats.  Limit cheese to 1 oz (28 g) per day.  Eat more home-cooked food and less restaurant, buffet, and fast food.  Limit fried foods.  Cook foods using methods other than frying.  Limit canned vegetables. If you do use them, rinse them well to decrease the sodium.  When eating at a restaurant, ask that your food be prepared with less salt, or no salt if possible. WHAT FOODS CAN I EAT? Seek help from a dietitian for individual calorie needs. Grains Whole grain or whole wheat bread. Brown rice. Whole grain or whole wheat pasta. Quinoa, bulgur, and whole grain cereals. Low-sodium cereals. Corn or whole wheat flour tortillas. Whole grain cornbread. Whole grain crackers. Low-sodium crackers. Vegetables Fresh or frozen vegetables (raw, steamed, roasted, or grilled). Low-sodium or reduced-sodium tomato and vegetable juices. Low-sodium or reduced-sodium tomato sauce and paste. Low-sodium or reduced-sodium canned vegetables.  Fruits All fresh, canned (in natural juice), or frozen fruits. Meat and  Other Protein Products Ground beef (85% or leaner), grass-fed beef, or beef trimmed of fat. Skinless chicken or [place]. Ground chicken or [place]. Pork trimmed of fat. All fish and seafood. Eggs. Dried beans, peas, or lentils. Unsalted nuts and seeds. Unsalted canned beans. Dairy Low-fat dairy products, such as skim or 1% milk, 2% or reduced-fat cheeses, low-fat ricotta or cottage cheese, or plain low-fat yogurt. Low-sodium or reduced-sodium cheeses. Fats and Oils Tub margarines without trans fats. Light or reduced-fat mayonnaise and salad dressings (reduced sodium). Avocado. Safflower, olive, or canola oils. Natural peanut or  almond butter. Other Unsalted popcorn and pretzels. The items listed above may not be a complete list of recommended foods or beverages. Contact your dietitian for more options. WHAT FOODS ARE NOT RECOMMENDED? Grains White bread. White pasta. White rice. Refined cornbread. Bagels and croissants. Crackers that contain trans fat. Vegetables Creamed or fried vegetables. Vegetables in a cheese sauce. Regular canned vegetables. Regular canned tomato sauce and paste. Regular tomato and vegetable juices. Fruits Dried fruits. Canned fruit in light or heavy syrup. Fruit juice. Meat and Other Protein Products Fatty cuts of meat. Ribs, chicken wings, bacon, sausage, bologna, salami, chitterlings, fatback, hot dogs, bratwurst, and packaged luncheon meats. Salted nuts and seeds. Canned beans with salt. Dairy Whole or 2% milk, cream, half-and-half, and cream cheese. Whole-fat or sweetened yogurt. Full-fat cheeses or blue cheese. Nondairy creamers and whipped toppings. Processed cheese, cheese spreads, or cheese curds. Condiments Onion and garlic salt, seasoned salt, table salt, and sea salt. Canned and packaged gravies. Worcestershire sauce. Tartar sauce. Barbecue sauce. Teriyaki sauce. Soy sauce, including reduced sodium. Steak sauce. Fish sauce. Oyster sauce. Cocktail sauce. Horseradish. Ketchup and mustard. Meat flavorings and tenderizers. Bouillon cubes. Hot sauce. Tabasco sauce. Marinades. Taco seasonings. Relishes. Fats and Oils Butter, stick margarine, lard, shortening, ghee, and bacon fat. Coconut, palm kernel, or palm oils. Regular salad dressings. Other Pickles and olives. Salted popcorn and pretzels. The items listed above may not be a complete list of foods and beverages to avoid. Contact your dietitian for more information. WHERE CAN I FIND MORE INFORMATION? National Heart, Lung, and Blood Institute: travelstabloid.com   This information is not intended to  replace advice given to you by your health care provider. Make sure you discuss any questions you have with your health care provider.   Document Released: 11/18/2011 Document Revised: 12/20/2014 Document Reviewed: 10/03/2013 Elsevier Interactive Patient Education 2016 [place] [employer] Calories are energy you get from the things you eat and drink. Your body uses this energy to keep you going throughout the day. The number of calories you eat affects your weight. When you eat more calories than your body needs, your body stores the extra calories as fat. When you eat fewer calories than your body needs, your body burns fat to get the energy it needs. Calorie counting means keeping track of how many calories you eat and drink each day. If you make sure to eat fewer calories than your body needs, you should lose weight. In order for calorie counting to work, you will need to eat the number of calories that are right for you in a day to lose a healthy amount of weight per week. A healthy amount of weight to lose per week is usually 1-2 lb (0.5-0.9 kg). A dietitian can determine how many calories you need in a day and give you suggestions on how to reach your calorie goal.  WHAT IS MY MY PLAN?  My goal is to have 1200 calories per day.  If I have this many calories per day, I should lose around 1 pound per week. WHAT DO I NEED TO KNOW ABOUT CALORIE COUNTING? In order to meet your daily calorie goal, you will need to:  Find out how many calories are in each food you would like to eat. Try to do this before you eat.  Decide how much of the food you can eat.  Write down what you ate and how many calories it had. Doing this is called keeping a food log. WHERE DO I FIND CALORIE INFORMATION? The number of calories in a food can be found on a Nutrition Facts label. Note that all the information on a label is based on a specific serving of the food. If a food does not have a  Nutrition Facts label, try to look up the calories online or ask your dietitian for help. HOW DO I DECIDE HOW MUCH TO EAT? To decide how much of the food you can eat, you will need to consider both the number of calories in one serving and the size of one serving. This information can be found on the Nutrition Facts label. If a food does not have a Nutrition Facts label, look up the information online or ask your dietitian for help. Remember that calories are listed per serving. If you choose to have more than one serving of a food, you will have to multiply the calories per serving by the amount of servings you plan to eat. For example, the label on a package of bread might say that a serving size is 1 slice and that there are 90 calories in a serving. If you eat 1 slice, you will have eaten 90 calories. If you eat 2 slices, you will have eaten 180 calories. HOW DO I KEEP A FOOD LOG? After each meal, record the following information in your food log:  What you ate.  How much of it you ate.  How many calories it had.  Then, add up your calories. Keep your food log near you, such as in a small notebook in your pocket. Another option is to use a mobile app or website. Some programs will calculate calories for you and show you how many calories you have left each time you add an item to the log. WHAT ARE SOME CALORIE COUNTING TIPS?  Use your calories on foods and drinks that will fill you up and not leave you hungry. Some examples of this include foods like nuts and nut butters, vegetables, lean proteins, and high-fiber foods (more than 5 g fiber per serving).  Eat nutritious foods and avoid empty calories. Empty calories are calories you get from foods or beverages that do not have many nutrients, such as candy and soda. It is better to have a nutritious high-calorie food (such as an avocado) than a food with few nutrients (such as a bag of chips).  Know how many calories are in the foods you eat  most often. This way, you do not have to look up how many calories they have each time you eat them.  Look out for foods that may seem like low-calorie foods but are really high-calorie foods, such as baked goods, soda, and fat-free candy.  Pay attention to calories in drinks. Drinks such as sodas, specialty coffee drinks, alcohol, and juices have a lot of calories yet do not fill you up. Choose low-calorie drinks like water and diet  drinks.  Focus your calorie counting efforts on higher calorie items. Logging the calories in a garden salad that contains only vegetables is less important than calculating the calories in a milk shake.  Find a way of tracking calories that works for you. Get creative. Most people who are successful find ways to keep track of how much they eat in a day, even if they do not count every calorie. WHAT ARE SOME PORTION CONTROL TIPS?  Know how many calories are in a serving. This will help you know how many servings of a certain food you can have.  Use a measuring cup to measure serving sizes. This is helpful when you start out. With time, you will be able to estimate serving sizes for some foods.  Take some time to put servings of different foods on your favorite plates, bowls, and cups so you know what a serving looks like.  Try not to eat straight from a bag or box. Doing this can lead to overeating. Put the amount you would like to eat in a cup or on a plate to make sure you are eating the right portion.  Use smaller plates, glasses, and bowls to prevent overeating. This is a quick and easy way to practice portion control. If your plate is smaller, less food can fit on it.  Try not to multitask while eating, such as watching TV or using your computer. If it is time to eat, sit down at a table and enjoy your food. Doing this will help you to start recognizing when you are full. It will also make you more aware of what and how much you are eating. HOW CAN I CALORIE  COUNT WHEN EATING OUT?  Ask for smaller portion sizes or child-sized portions.  Consider sharing an entree and sides instead of getting your own entree.  If you get your own entree, eat only half. Ask for a box at the beginning of your meal and put the rest of your entree in it so you are not tempted to eat it.  Look for the calories on the menu. If calories are listed, choose the lower calorie options.  Choose dishes that include vegetables, fruits, whole grains, low-fat dairy products, and lean protein. Focusing on smart food choices from each of the 5 food groups can help you stay on track at restaurants.  Choose items that are boiled, broiled, grilled, or steamed.  Choose water, milk, unsweetened iced tea, or other drinks without added sugars. If you want an alcoholic beverage, choose a lower calorie option. For example, a regular margarita can have up to 700 calories and a glass of wine has around 150.  Stay away from items that are buttered, battered, fried, or served with cream sauce. Items labeled "crispy" are usually fried, unless stated otherwise.  Ask for dressings, sauces, and syrups on the side. These are usually very high in calories, so do not eat much of them.  Watch out for salads. Many people think salads are a healthy option, but this is often not the case. Many salads come with bacon, fried chicken, lots of cheese, fried chips, and dressing. All of these items have a lot of calories. If you want a salad, choose a garden salad and ask for grilled meats or steak. Ask for the dressing on the side, or ask for olive oil and vinegar or lemon to use as dressing.  Estimate how many servings of a food you are given. For example, a  serving of cooked rice is  cup or about the size of half a tennis ball or one cupcake wrapper. Knowing serving sizes will help you be aware of how much food you are eating at restaurants. The list below tells you how big or small some common portion sizes  are based on everyday objects.  1 oz--4 stacked dice.  3 oz--1 deck of cards.  1 tsp--1 dice.  1 Tbsp-- a Ping-Pong ball.  2 Tbsp--1 Ping-Pong ball.   cup--1 tennis ball or 1 cupcake wrapper.  1 cup--1 baseball.   This information is not intended to replace advice given to you by your health care provider. Make sure you discuss any questions you have with your health care provider.   Document Released: 11/29/2005 Document Revised: 12/20/2014 Document Reviewed: 10/04/2013 Elsevier Interactive Patient Education [employer].

## 2015-12-11 NOTE — Progress Notes (Signed)
Patient ID: Rachel Wells, female   DOB: 12/25/1970, 44 y.o.   MRN: [id number]       Patient: Rachel Wells Female    DOB: 16-Jul-1971   44 y.o.   MRN: [id number] Visit Date: 12/11/2015  Today's Provider: Mar Daring, PA-C   Chief Complaint  Patient presents with  . Hypertension   Subjective:    HPI  Patient is here for 6 months follow up. Patient checks her B/P occasionally and feels like it is still running high than it should but she could not recall the readings she gets. She states that readings are normally over 712 and the diastolic reading is normally in the 90s. BP Readings from Last 3 Encounters:  12/11/15 140/82  08/22/15 130/80  05/20/15 118/82   She is still taking HCTZ and Metoprolol. She has CBC and MetC checked on her last visit 6 months ago.    Allergies  Allergen Reactions  . Neomycin-Bacitracin Zn-Polymyx     REACTION: rash, itch  . Shrimp [Shellfish Allergy] Swelling   Previous Medications   CETIRIZINE HCL 10 MG CAPS    Take 1 tablet by mouth daily.   HYDROCHLOROTHIAZIDE (HYDRODIURIL) 12.5 MG TABLET    Take 1 tablet (12.5 mg total) by mouth daily.   METOPROLOL SUCCINATE (TOPROL-XL) 50 MG 24 HR TABLET    TAKE 1 TABLET BY MOUTH DAILY   MULTIPLE VITAMIN PO    Take 1 tablet by mouth daily.    Review of Systems  Constitutional: Negative.   Respiratory: Negative.   Cardiovascular: Negative.   Gastrointestinal: Negative.   Musculoskeletal: Negative.   Neurological: Negative.     Social History  Substance Use Topics  . Smoking status: Never Smoker   . Smokeless tobacco: Not on file  . Alcohol Use: No   Objective:   BP 140/82 mmHg  Pulse 68  Temp(Src) 98.6 F (37 C)  Resp 12  Wt 215 lb (97.523 kg)  Physical Exam  Constitutional: She appears well-developed and well-nourished. No distress.  Neck: Normal range of motion. Neck supple. No tracheal deviation present. No thyromegaly present.  Cardiovascular: Normal rate, regular rhythm and  normal heart sounds.  Exam reveals no gallop and no friction rub.   No murmur heard. Pulmonary/Chest: Effort normal and breath sounds normal. No respiratory distress. She has no wheezes. She has no rales.  Lymphadenopathy:    She has no cervical adenopathy.  Skin: She is not diaphoretic.  Vitals reviewed.       Assessment & Plan:     1. Essential hypertension I will check labs as below as her thyroid has not been checked. She is reporting abnormal weight gain year with exercise. She has gained 5 pounds since her previous visit here in September. I will also increase her hydrochlorothiazide to 25 mg in hopes for better control of her blood pressure. I will see her back in 4 weeks to recheck her blood pressure. She is to call the office if she has any adverse reactions to the medication, questions or concerns in the meantime. - TSH - CBC With Differential - hydrochlorothiazide (HYDRODIURIL) 25 MG tablet; Take 1 tablet (25 mg total) by mouth daily.  Dispense: 30 tablet; Refill: 1  2. Weight gain She has had a 5 pound weight gain since her last visit here in September. She states she is currently working out 4-5 times per week. She states she goes for 30-40 minutes and walks on the treadmill. She says that each time she  goes she is walking about a mile and a half. She is not currently using a food diary. I will check her labs as above to see if they could be playing a factor in her weight gain. I will follow-up with her pending the lab results. I did advise her to begin using a food diary. I advised her to make sure to stay above 1000 cal. I do feel that a 1200-calorie diet would be appropriate for her. She agrees to do so. I will see her back in 4 weeks when she comes to have her blood pressure rechecked. If her blood pressure has stabilized we will discuss possibly beginning an appetite suppressant to help with weight loss along with the calorie restricted diet and exercise.       Mar Daring, PA-C  [facility]

## 2015-12-12 LAB — CBC WITH DIFFERENTIAL
BASOS ABS: 0 10*3/uL (ref 0.0–0.2)
Basos: 1 %
EOS (ABSOLUTE): 0.3 10*3/uL (ref 0.0–0.4)
Eos: 4 %
HEMOGLOBIN: 12.7 g/dL (ref 11.1–15.9)
Hematocrit: 37.5 % (ref 34.0–46.6)
IMMATURE GRANS (ABS): 0 10*3/uL (ref 0.0–0.1)
IMMATURE GRANULOCYTES: 0 %
LYMPHS ABS: 2.6 10*3/uL (ref 0.7–3.1)
LYMPHS: 31 %
MCH: 27.7 pg (ref 26.6–33.0)
MCHC: 33.9 g/dL (ref 31.5–35.7)
MCV: 82 fL (ref 79–97)
MONOCYTES: 9 %
Monocytes Absolute: 0.8 10*3/uL (ref 0.1–0.9)
Neutrophils Absolute: 4.5 10*3/uL (ref 1.4–7.0)
Neutrophils: 55 %
RBC: 4.58 x10E6/uL (ref 3.77–5.28)
RDW: 13.6 % (ref 12.3–15.4)
WBC: 8.2 10*3/uL (ref 3.4–10.8)

## 2015-12-12 LAB — TSH: TSH: 1.17 u[IU]/mL (ref 0.450–4.500)

## 2016-01-15 ENCOUNTER — Encounter: Payer: Self-pay | Admitting: Physician Assistant

## 2016-01-15 ENCOUNTER — Ambulatory Visit (INDEPENDENT_AMBULATORY_CARE_PROVIDER_SITE_OTHER): Payer: BC Managed Care – PPO | Admitting: Physician Assistant

## 2016-01-15 VITALS — BP 120/78 | HR 77 | Temp 98.2°F | Resp 16 | Ht 63.5 in | Wt 214.0 lb

## 2016-01-15 DIAGNOSIS — Z713 Dietary counseling and surveillance: Secondary | ICD-10-CM

## 2016-01-15 DIAGNOSIS — I10 Essential (primary) hypertension: Secondary | ICD-10-CM

## 2016-01-15 DIAGNOSIS — E669 Obesity, unspecified: Secondary | ICD-10-CM

## 2016-01-15 DIAGNOSIS — Z6837 Body mass index (BMI) 37.0-37.9, adult: Secondary | ICD-10-CM

## 2016-01-15 MED ORDER — HYDROCHLOROTHIAZIDE 25 MG PO TABS: 25.0000 mg | ORAL_TABLET | Freq: Every day | ORAL | Status: DC

## 2016-01-15 MED ORDER — PHENTERMINE HCL 30 MG PO CAPS: 30.0000 mg | ORAL_CAPSULE | ORAL | Status: DC

## 2016-01-15 NOTE — Progress Notes (Signed)
Patient: Rachel Wells Female    DOB: 1970/12/29   45 y.o.   MRN: [id number] Visit Date: 01/15/2016  Today's Provider: Mar Daring, PA-C   Chief Complaint  Patient presents with  . Follow-up  . Hypertension  . Weight Check   Subjective:    HPI   Follow-up for weight gain from 12/11/2015; advised to begin using a food diary. Advised to make sure to stay above 1000 cal.   Hypertension, follow-up:  BP Readings from Last 3 Encounters:  01/15/16 120/78  12/11/15 140/82  08/22/15 130/80    She was last seen for hypertension 1 months ago.  BP at that visit was 140/82. Management since that visit includes; increase hydrochlorothiazide to 25 mg in hopes for better control of her blood pressure.She reports good compliance with treatment. She is not having side effects. none  She is exercising. She is adherent to low salt diet.   Outside blood pressures are n/a. She is experiencing none.  Patient denies none.   Cardiovascular risk factors include none.  Use of agents associated with hypertension: none.   ----------------------------------------------------------------------    Allergies  Allergen Reactions  . Neomycin-Bacitracin Zn-Polymyx     REACTION: rash, itch  . Shrimp [Shellfish Allergy] Swelling   Previous Medications   CETIRIZINE HCL 10 MG CAPS    Take 1 tablet by mouth daily.   HYDROCHLOROTHIAZIDE (HYDRODIURIL) 25 MG TABLET    Take 1 tablet (25 mg total) by mouth daily.   METOPROLOL SUCCINATE (TOPROL-XL) 50 MG 24 HR TABLET    TAKE 1 TABLET BY MOUTH DAILY   MULTIPLE VITAMIN PO    Take 1 tablet by mouth daily.    Review of Systems  Constitutional: Negative for fever, chills, appetite change and fatigue.  Eyes: Negative for visual disturbance.  Respiratory: Negative for chest tightness and shortness of breath.   Cardiovascular: Negative for chest pain and palpitations.  Gastrointestinal: Negative for nausea, vomiting and abdominal pain.    Neurological: Negative for dizziness, weakness and headaches.    Social History  Substance Use Topics  . Smoking status: Never Smoker   . Smokeless tobacco: Not on file  . Alcohol Use: No   Objective:   BP 120/78 mmHg  Pulse 77  Temp(Src) 98.2 F (36.8 C) (Oral)  Resp 16  Ht 5' 3.5" (1.613 m)  Wt 214 lb (97.07 kg)  BMI 37.31 kg/m2  SpO2 96%  Physical Exam  Constitutional: She appears well-developed and well-nourished. No distress.  Cardiovascular: Normal rate, regular rhythm and normal heart sounds.  Exam reveals no gallop and no friction rub.   No murmur heard. Pulmonary/Chest: Effort normal and breath sounds normal. No respiratory distress. She has no wheezes. She has no rales.  Skin: She is not diaphoretic.  Vitals reviewed.       Assessment & Plan:     1. Encounter for weight loss counseling She has been exercising regularly and has started a food diary over the last 4-6 weeks. She has only lost 1 pound of weight per our records since then.  I will add phentermine as below for appetite suppression.  She is to continue her exercise routine and continue her food diary adhering to a 1200-1400 calorie diet. She voiced understanding. I will see her back in 4 weeks to recheck her weight and see how she is doing. - phentermine 30 MG capsule; Take 1 capsule (30 mg total) by mouth every morning.  Dispense: 30 capsule; Refill:  0  2. Obesity See above medical treatment plan. - phentermine 30 MG capsule; Take 1 capsule (30 mg total) by mouth every morning.  Dispense: 30 capsule; Refill: 0  3. BMI 37.0-37.9, adult See above medical treatment plan.  4. Essential hypertension Stable on current dose. Continue current medical treatment plan. - hydrochlorothiazide (HYDRODIURIL) 25 MG tablet; Take 1 tablet (25 mg total) by mouth daily.  Dispense: 90 tablet; Refill: [place], PA-C  [facility]

## 2016-02-12 ENCOUNTER — Ambulatory Visit: Payer: BC Managed Care – PPO | Admitting: Physician Assistant

## 2016-02-20 ENCOUNTER — Ambulatory Visit (INDEPENDENT_AMBULATORY_CARE_PROVIDER_SITE_OTHER): Payer: BC Managed Care – PPO | Admitting: Physician Assistant

## 2016-02-20 ENCOUNTER — Encounter: Payer: Self-pay | Admitting: Physician Assistant

## 2016-02-20 VITALS — BP 150/90 | HR 89 | Temp 98.5°F | Resp 16 | Wt 207.8 lb

## 2016-02-20 DIAGNOSIS — L539 Erythematous condition, unspecified: Secondary | ICD-10-CM | POA: Diagnosis not present

## 2016-02-20 DIAGNOSIS — E669 Obesity, unspecified: Secondary | ICD-10-CM

## 2016-02-20 DIAGNOSIS — Z6836 Body mass index (BMI) 36.0-36.9, adult: Secondary | ICD-10-CM

## 2016-02-20 DIAGNOSIS — Z713 Dietary counseling and surveillance: Secondary | ICD-10-CM | POA: Diagnosis not present

## 2016-02-20 MED ORDER — PHENTERMINE HCL 30 MG PO CAPS: 30.0000 mg | ORAL_CAPSULE | ORAL | Status: DC

## 2016-02-20 NOTE — Progress Notes (Signed)
Patient: Chrystal Zeimet Female    DOB: [birth year]   45 y.o.   MRN: [id number] Visit Date: 02/20/2016  Today's Provider: Mar Daring, PA-C   Chief Complaint  Patient presents with  . Follow-up    Weight   Subjective:    HPI Tamika Shropshire is here for her 4 week follow-up weight. Her weight time was 214 lbs. Patient was advised to continue the exercise routine and her food diary adhering to a 1200-1400 calorie diet as she was doing. Her weight today is 207.8 lbs. She is exercising 5 times a week on the tread mill or DVD's. Her diet consist of three meals,  two healthy snacks, she is not counting her calorie but is keeping a diary. She is drinking a lot of water. She reports no side effect from the Phentermine 63m. She does has concern today about her nose hurting like when "someone hits you" started with numbness on the top of her lip and now it hurst not as much but inside of her nose.Her blood pressure is elevated today 150/90 she reports she had a very stressful day today.       Allergies  Allergen Reactions  . Neomycin-Bacitracin Zn-Polymyx     REACTION: rash, itch  . Shrimp [Shellfish Allergy] Swelling   Previous Medications   CETIRIZINE HCL 10 MG CAPS    Take 1 tablet by mouth daily.   HYDROCHLOROTHIAZIDE (HYDRODIURIL) 25 MG TABLET    Take 1 tablet (25 mg total) by mouth daily.   METOPROLOL SUCCINATE (TOPROL-XL) 50 MG 24 HR TABLET    TAKE 1 TABLET BY MOUTH DAILY   MULTIPLE VITAMIN PO    Take 1 tablet by mouth daily.   PHENTERMINE 30 MG CAPSULE    Take 1 capsule (30 mg total) by mouth every morning.    Review of Systems  Constitutional: Negative.   HENT: Positive for congestion.   Respiratory: Negative.   Cardiovascular: Negative.   Gastrointestinal: Negative.   Neurological: Negative.   Psychiatric/Behavioral: Negative.     Social History  Substance Use Topics  . Smoking status: Never Smoker   . Smokeless tobacco: Not on file  . Alcohol Use: No    Objective:   BP 150/90 mmHg  Pulse 89  Temp(Src) 98.5 F (36.9 C) (Oral)  Resp 16  Wt 207 lb 12.8 oz (94.257 kg)  LMP 01/27/2016  Physical Exam  Constitutional: She appears well-developed and well-nourished. No distress.  HENT:  Nose: Mucosal edema and sinus tenderness present. No rhinorrhea or nasal deformity. No epistaxis.  Cardiovascular: Normal rate, regular rhythm and normal heart sounds.  Exam reveals no gallop and no friction rub.   No murmur heard. Pulmonary/Chest: Effort normal and breath sounds normal. No respiratory distress. She has no wheezes. She has no rales.  Skin: She is not diaphoretic.  Vitals reviewed.       Assessment & Plan:     1. Encounter for weight loss counseling She has done very well and lost 7 pounds over the last 4 weeks. She has been working out 5 days a week and keeping a food diary. Her blood pressure was slightly elevated today in the office that she thinks it was due to stress and having to keep both of her children today. I did advise her to keep a couple checks of her blood pressure over the next 4 weeks to see if it is continually running this high. If so may decrease phentermine dose  to 15 mg to avoid hypertension. I have refilled her phentermine 30 mg as below for appetite suppression. I will see her back in 4 weeks to see how she is continuing to progress. - phentermine 30 MG capsule; Take 1 capsule (30 mg total) by mouth every morning.  Dispense: 30 capsule; Refill: 0  2. Obesity See above medical treatment plan. - phentermine 30 MG capsule; Take 1 capsule (30 mg total) by mouth every morning.  Dispense: 30 capsule; Refill: 0  3. BMI 36.0-36.9,adult See above medical treatment plan.  4. Nasal erythema She does have inflammation and redness of the nasal mucosa and turbinates. I did advise her to start her Flonase back up to see if this helps some of the discomfort she is having. She is to call if this does not improve symptoms.         Mar Daring, PA-C  [facility]

## 2016-02-20 NOTE — Patient Instructions (Signed)
Exercising to Lose Weight Exercising can help you to lose weight. In order to lose weight through exercise, you need to do vigorous-intensity exercise. You can tell that you are exercising with vigorous intensity if you are breathing very hard and fast and cannot hold a conversation while exercising. Moderate-intensity exercise helps to maintain your current weight. You can tell that you are exercising at a moderate level if you have a higher heart rate and faster breathing, but you are still able to hold a conversation. HOW OFTEN SHOULD I EXERCISE? Choose an activity that you enjoy and set realistic goals. Your health care provider can help you to make an activity plan that works for you. Exercise regularly as directed by your health care provider. This may include:  Doing resistance training twice each week, such as:  Push-ups.  Sit-ups.  Lifting weights.  Using resistance bands.  Doing a given intensity of exercise for a given amount of time. Choose from these options:  150 minutes of moderate-intensity exercise every week.  75 minutes of vigorous-intensity exercise every week.  A mix of moderate-intensity and vigorous-intensity exercise every week. Children, pregnant women, people who are out of shape, people who are overweight, and older adults may need to consult a health care provider for individual recommendations. If you have any sort of medical condition, be sure to consult your health care provider before starting a new exercise program. WHAT ARE SOME ACTIVITIES THAT CAN HELP ME TO LOSE WEIGHT?   Walking at a rate of at least 4.5 miles an hour.  Jogging or running at a rate of 5 miles per hour.  Biking at a rate of at least 10 miles per hour.  Lap swimming.  Roller-skating or in-line skating.  Cross-country skiing.  Vigorous competitive sports, such as football, basketball, and soccer.  Jumping rope.  Aerobic dancing. HOW CAN I BE MORE ACTIVE IN MY DAY-TO-DAY  ACTIVITIES?  Use the stairs instead of the elevator.  Take a walk during your lunch break.  If you drive, park your car farther away from work or school.  If you take public transportation, get off one stop early and walk the rest of the way.  Make all of your phone calls while standing up and walking around.  Get up, stretch, and walk around every 30 minutes throughout the day. WHAT GUIDELINES SHOULD I FOLLOW WHILE EXERCISING?  Do not exercise so much that you hurt yourself, feel dizzy, or get very short of breath.  Consult your health care provider prior to starting a new exercise program.  Wear comfortable clothes and shoes with good support.  Drink plenty of water while you exercise to prevent dehydration or heat stroke. Body water is lost during exercise and must be replaced.  Work out until you breathe faster and your heart beats faster.   This information is not intended to replace advice given to you by your health care provider. Make sure you discuss any questions you have with your health care provider.   Document Released: 01/01/2011 Document Revised: 12/20/2014 Document Reviewed: 05/02/2014 Elsevier Interactive Patient Education [employer].

## 2016-03-15 ENCOUNTER — Encounter: Payer: Self-pay | Admitting: Physician Assistant

## 2016-03-16 NOTE — Telephone Encounter (Signed)
Sent mychart message

## 2016-03-19 ENCOUNTER — Ambulatory Visit: Payer: BC Managed Care – PPO | Admitting: Physician Assistant

## 2016-03-25 ENCOUNTER — Ambulatory Visit: Payer: BC Managed Care – PPO | Admitting: Physician Assistant

## 2016-03-26 ENCOUNTER — Ambulatory Visit (INDEPENDENT_AMBULATORY_CARE_PROVIDER_SITE_OTHER): Payer: BC Managed Care – PPO | Admitting: Physician Assistant

## 2016-03-26 ENCOUNTER — Encounter: Payer: Self-pay | Admitting: Physician Assistant

## 2016-03-26 VITALS — BP 140/72 | HR 82 | Temp 98.0°F | Resp 16 | Wt 204.8 lb

## 2016-03-26 DIAGNOSIS — Z6835 Body mass index (BMI) 35.0-35.9, adult: Secondary | ICD-10-CM | POA: Diagnosis not present

## 2016-03-26 DIAGNOSIS — Z713 Dietary counseling and surveillance: Secondary | ICD-10-CM

## 2016-03-26 DIAGNOSIS — E669 Obesity, unspecified: Secondary | ICD-10-CM | POA: Diagnosis not present

## 2016-03-26 MED ORDER — PHENTERMINE HCL 37.5 MG PO CAPS: 37.5000 mg | ORAL_CAPSULE | ORAL | Status: DC

## 2016-03-26 NOTE — Progress Notes (Signed)
       Patient: Rachel Wells Female    DOB: 25-Nov-1971   45 y.o.   MRN: [id number] Visit Date: 03/26/2016  Today's Provider: Mar Daring, PA-C   Chief Complaint  Patient presents with  . Follow-up    weight   Subjective:    HPI  Patient is here for her weight loss counseling. She has been working out 5 days a week and keeping a food diary. Only side effect from the Phentermine is dry mouth.  Wt Readings from Last 3 Encounters:  03/26/16 204 lb 12.8 oz (92.897 kg)  02/20/16 207 lb 12.8 oz (94.257 kg)  01/15/16 214 lb (97.07 kg)  Current Exercise Habits: Home exercise routine, Time (Minutes): 30 (from 30 to 45), Frequency (Times/Week): 5, Weekly Exercise (Minutes/Week): 150, Intensity: Moderate       Allergies  Allergen Reactions  . Neomycin-Bacitracin Zn-Polymyx     REACTION: rash, itch  . Shrimp [Shellfish Allergy] Swelling   Previous Medications   CETIRIZINE HCL 10 MG CAPS    Take 1 tablet by mouth daily.   HYDROCHLOROTHIAZIDE (HYDRODIURIL) 25 MG TABLET    Take 1 tablet (25 mg total) by mouth daily.   METOPROLOL SUCCINATE (TOPROL-XL) 50 MG 24 HR TABLET    TAKE 1 TABLET BY MOUTH DAILY   MULTIPLE VITAMIN PO    Take 1 tablet by mouth daily.   PHENTERMINE 30 MG CAPSULE    Take 1 capsule (30 mg total) by mouth every morning.    Review of Systems  Constitutional: Negative.   Respiratory: Negative.   Cardiovascular: Negative for chest pain, palpitations and leg swelling.  Gastrointestinal: Negative.   Psychiatric/Behavioral: Negative.     Social History  Substance Use Topics  . Smoking status: Never Smoker   . Smokeless tobacco: Not on file  . Alcohol Use: No   Objective:   BP 140/72 mmHg  Pulse 82  Temp(Src) 98 F (36.7 C) (Oral)  Resp 16  Wt 204 lb 12.8 oz (92.897 kg)  LMP 02/26/2016  Physical Exam  Constitutional: She appears well-developed and well-nourished. No distress.  Cardiovascular: Normal rate, regular rhythm and normal heart sounds.   Exam reveals no gallop and no friction rub.   No murmur heard. Pulmonary/Chest: Effort normal and breath sounds normal. No respiratory distress. She has no wheezes. She has no rales.  Skin: She is not diaphoretic.  Psychiatric: She has a normal mood and affect. Her behavior is normal. Judgment and thought content normal.  Vitals reviewed.       Assessment & Plan:     1. Encounter for weight loss counseling Will increase phentermine to 37.61m to see if she continues to tolerate the medication and offer more appetite suppression. She is to continue exercising 5 days per week 30-40 minutes per day. Continue food diary with calorie restriction of 1200 calories per day. I will see her back in 4 weeks to see how she is doing with weight loss and how she tolerates the increase in phentermine. - phentermine 37.5 MG capsule; Take 1 capsule (37.5 mg total) by mouth every morning.  Dispense: 30 capsule; Refill: 0  2. Obesity See above medical treatment plan. - phentermine 37.5 MG capsule; Take 1 capsule (37.5 mg total) by mouth every morning.  Dispense: 30 capsule; Refill: 0  3. BMI 35.0-35.9,adult See above medical treatment plan.       JMar Daring PA-C  [facility]

## 2016-03-30 ENCOUNTER — Encounter: Payer: Self-pay | Admitting: Physician Assistant

## 2016-04-12 ENCOUNTER — Encounter: Payer: Self-pay | Admitting: Family Medicine

## 2016-04-12 ENCOUNTER — Other Ambulatory Visit: Payer: Self-pay

## 2016-04-12 ENCOUNTER — Ambulatory Visit (INDEPENDENT_AMBULATORY_CARE_PROVIDER_SITE_OTHER): Payer: BC Managed Care – PPO | Admitting: Family Medicine

## 2016-04-12 VITALS — BP 150/98 | HR 80 | Temp 98.5°F | Resp 14 | Wt 206.0 lb

## 2016-04-12 DIAGNOSIS — J0100 Acute maxillary sinusitis, unspecified: Secondary | ICD-10-CM | POA: Diagnosis not present

## 2016-04-12 MED ORDER — AMOXICILLIN 875 MG PO TABS: 875.0000 mg | ORAL_TABLET | Freq: Two times a day (BID) | ORAL | Status: DC

## 2016-04-12 NOTE — Progress Notes (Signed)
Patient ID: Rachel Wells, female   DOB: 1971-03-12, 45 y.o.   MRN: [id number]   Patient: Rachel Wells Female    DOB: September 28, 1971   45 y.o.   MRN: [id number] Visit Date: 04/12/2016  Today's Provider: Vernie Murders, PA   Chief Complaint  Patient presents with  . Sinusitis   Subjective:    Sinusitis This is a new problem. The current episode started in the past 7 days. The problem is unchanged. Maximum temperature: Low grade at 99.5 last week. Associated symptoms include coughing, ear pain, headaches, neck pain, sinus pressure and sneezing. Treatments tried: OTC sinus medication. The treatment provided no relief.   Patient Active Problem List   Diagnosis Date Noted  . Ear pain 08/23/2015  . Fatigue 04/18/2015  . Fibroid 04/18/2015  . BP (high blood pressure) 03/22/2008  . Headache, migraine 03/22/2008   Past Surgical History  Procedure Laterality Date  . Tonsillectomy and adenoidectomy    . Dilation and curettage of uterus  1992   Family History  Problem Relation Age of Onset  . Fibromyalgia Mother   . Hypertension Father   . Diabetes Brother   . Hypertension Paternal Grandmother   . Stroke Paternal Grandmother   . Heart disease Paternal Grandfather      Previous Medications   CETIRIZINE HCL 10 MG CAPS    Take 1 tablet by mouth daily.   HYDROCHLOROTHIAZIDE (HYDRODIURIL) 25 MG TABLET    Take 1 tablet (25 mg total) by mouth daily.   METOPROLOL SUCCINATE (TOPROL-XL) 50 MG 24 HR TABLET    TAKE 1 TABLET BY MOUTH DAILY   MIRENA, 52 MG, 20 MCG/24HR IUD       MULTIPLE VITAMIN PO    Take 1 tablet by mouth daily.   PHENTERMINE 30 MG CAPSULE       PHENTERMINE 37.5 MG CAPSULE    Take 1 capsule (37.5 mg total) by mouth every morning.   Allergies  Allergen Reactions  . Neomycin-Bacitracin Zn-Polymyx     REACTION: rash, itch  . Shrimp [Shellfish Allergy] Swelling    Review of Systems  Constitutional: Negative.   HENT: Positive for ear pain, sinus pressure and sneezing.   Eyes:  Negative.   Respiratory: Positive for cough.   Cardiovascular: Negative.   Gastrointestinal: Negative.   Endocrine: Negative.   Genitourinary: Negative.   Musculoskeletal: Positive for neck pain.  Skin: Negative.   Allergic/Immunologic: Negative.   Neurological: Positive for headaches.  Hematological: Negative.   Psychiatric/Behavioral: Negative.     Social History  Substance Use Topics  . Smoking status: Never Smoker   . Smokeless tobacco: Not on file  . Alcohol Use: No   Objective:   BP 150/98 mmHg  Pulse 80  Temp(Src) 98.5 F (36.9 C) (Oral)  Resp 14  Wt 206 lb (93.441 kg)  LMP 02/26/2016  Physical Exam  Constitutional: She is oriented to person, place, and time. She appears well-developed and well-nourished. No distress.  HENT:  Head: Normocephalic and atraumatic.  Right Ear: Hearing and external ear normal.  Left Ear: Hearing and external ear normal.  Mouth/Throat: Oropharynx is clear and moist.  Red and swollen right nostril turbinates.  Eyes: Conjunctivae and lids are normal. Right eye exhibits no discharge. Left eye exhibits no discharge. No scleral icterus.  Neck: Neck supple.  Cardiovascular: Normal rate and regular rhythm.   Pulmonary/Chest: Effort normal and breath sounds normal. No respiratory distress.  Musculoskeletal: Normal range of motion.  Lymphadenopathy:    She has no  cervical adenopathy.  Neurological: She is alert and oriented to person, place, and time.  Skin: Skin is intact. No lesion and no rash noted.  Psychiatric: She has a normal mood and affect. Her speech is normal and behavior is normal. Thought content normal.      Assessment & Plan:     1. Acute maxillary sinusitis, recurrence not specified Onset over the past weekend after a URI/allergies last week. Continue Zyrtec and nasal steroid spray. Add Mucinex-DM and antibiotic. Increase fluid intake and recheck if no better in the next 7-10 days. - amoxicillin (AMOXIL) 875 MG tablet;  Take 1 tablet (875 mg total) by mouth 2 (two) times daily.  Dispense: 20 tablet; Refill: 0

## 2016-04-12 NOTE — Patient Instructions (Signed)

## 2016-04-15 ENCOUNTER — Other Ambulatory Visit: Payer: Self-pay | Admitting: Family Medicine

## 2016-04-15 ENCOUNTER — Encounter: Payer: Self-pay | Admitting: Family Medicine

## 2016-04-15 MED ORDER — PREDNISONE 10 MG PO TABS: ORAL_TABLET | ORAL | Status: DC

## 2016-04-15 NOTE — Telephone Encounter (Signed)
Rachel Wells is out for the rest of the week.  Will you please take a look at this.  Thanks,   -Mickel Baas

## 2016-04-23 ENCOUNTER — Ambulatory Visit: Payer: BC Managed Care – PPO | Admitting: Physician Assistant

## 2016-05-14 ENCOUNTER — Ambulatory Visit: Payer: BC Managed Care – PPO | Admitting: Physician Assistant

## 2016-10-26 ENCOUNTER — Ambulatory Visit (INDEPENDENT_AMBULATORY_CARE_PROVIDER_SITE_OTHER): Payer: BC Managed Care – PPO | Admitting: Family Medicine

## 2016-10-26 ENCOUNTER — Encounter: Payer: Self-pay | Admitting: Family Medicine

## 2016-10-26 VITALS — BP 150/96 | HR 80 | Temp 97.9°F | Resp 16 | Wt 220.8 lb

## 2016-10-26 DIAGNOSIS — H6502 Acute serous otitis media, left ear: Secondary | ICD-10-CM | POA: Diagnosis not present

## 2016-10-26 DIAGNOSIS — I10 Essential (primary) hypertension: Secondary | ICD-10-CM

## 2016-10-26 DIAGNOSIS — J0100 Acute maxillary sinusitis, unspecified: Secondary | ICD-10-CM

## 2016-10-26 MED ORDER — AMOXICILLIN 875 MG PO TABS: 875.0000 mg | ORAL_TABLET | Freq: Two times a day (BID) | ORAL | 0 refills | Status: DC

## 2016-10-26 MED ORDER — FLUTICASONE PROPIONATE 50 MCG/ACT NA SUSP: 2.0000 | Freq: Every day | NASAL | 6 refills | Status: DC

## 2016-10-26 NOTE — Progress Notes (Signed)
Patient: Rachel Wells Female    DOB: April 04, 1971   45 y.o.   MRN: [id number] Visit Date: 10/26/2016  Today's Provider: Vernie Murders, PA   Chief Complaint  Patient presents with  . Ear Pain   Subjective:    Otalgia   There is pain in the left ear. This is a new problem. The current episode started in the past 7 days. The problem occurs constantly. The problem has been gradually worsening. Associated symptoms include neck pain. She has tried NSAIDs and heat packs for the symptoms. The treatment provided mild relief.   Patient Active Problem List   Diagnosis Date Noted  . Ear pain 08/23/2015  . Fatigue 04/18/2015  . Fibroid 04/18/2015  . BP (high blood pressure) 03/22/2008  . Headache, migraine 03/22/2008   Past Surgical History:  Procedure Laterality Date  . DILATION AND CURETTAGE OF UTERUS  1992  . TONSILLECTOMY AND ADENOIDECTOMY     Family History  Problem Relation Age of Onset  . Fibromyalgia Mother   . Hypertension Father   . Diabetes Brother   . Hypertension Paternal Grandmother   . Stroke Paternal Grandmother   . Heart disease Paternal Grandfather    Allergies  Allergen Reactions  . Neomycin-Bacitracin Zn-Polymyx     REACTION: rash, itch  . Shrimp [Shellfish Allergy] Swelling     Previous Medications   CETIRIZINE HCL 10 MG CAPS    Take 1 tablet by mouth daily.   HYDROCHLOROTHIAZIDE (HYDRODIURIL) 25 MG TABLET    Take 1 tablet (25 mg total) by mouth daily.   METOPROLOL SUCCINATE (TOPROL-XL) 50 MG 24 HR TABLET    TAKE 1 TABLET BY MOUTH DAILY   MIRENA, 52 MG, 20 MCG/24HR IUD       MULTIPLE VITAMIN PO    Take 1 tablet by mouth daily.    Review of Systems  Constitutional: Negative.   HENT: Positive for ear pain.   Respiratory: Negative.   Cardiovascular: Negative.   Musculoskeletal: Positive for neck pain.    Social History  Substance Use Topics  . Smoking status: Never Smoker  . Smokeless tobacco: Not on file  . Alcohol use No   Objective:   BP  (!) 150/96 (BP Location: Right Arm, Patient Position: Sitting, Cuff Size: Large)   Pulse 80   Temp 97.9 F (36.6 C) (Oral)   Resp 16   Wt 220 lb 12.8 oz (100.2 kg)   BMI 38.50 kg/m  BP Readings from Last 3 Encounters:  10/26/16 (!) 150/96  04/12/16 (!) 150/98  03/26/16 140/72    Physical Exam  Constitutional: She is oriented to person, place, and time. She appears well-developed.  HENT:  Head: Atraumatic.  Right Ear: External ear normal.  Left Ear: External ear normal.  Mouth/Throat: Oropharynx is clear and moist.  Slightly reddened turbinates. No transillumination through the left maxillary sinus with some tenderness. Left TM has a fluid line but no erythema or drainage/perforation.  Eyes: Conjunctivae are normal.  Neck: Neck supple.  Cardiovascular: Normal rate and regular rhythm.   Pulmonary/Chest: Effort normal.  Neurological: She is alert and oriented to person, place, and time.      Assessment & Plan:      1. Acute serous otitis media of left ear, recurrence not specified Onset over the past week with stopped up sensation and pain. No fever or dizziness. Will treat with antibiotic, Mucinex, antihistamine and nasal steroid. Recheck if no better int 7-10 days. - amoxicillin (AMOXIL) 875 MG tablet; Take  1 tablet (875 mg total) by mouth 2 (two) times daily.  Dispense: 20 tablet; Refill: 0 - fluticasone (FLONASE) 50 MCG/ACT nasal spray; Place 2 sprays into both nostrils daily.  Dispense: 16 g; Refill: 6  2. Acute maxillary sinusitis, recurrence not specified Onset with earache over the past week. No transillumination and tenderness of the left maxillary sinus. Treat with antibiotic, Mucinex and Flonase. May need referral to ENT if no better in 7-10 days. - amoxicillin (AMOXIL) 875 MG tablet; Take 1 tablet (875 mg total) by mouth 2 (two) times daily.  Dispense: 20 tablet; Refill: 0 - fluticasone (FLONASE) 50 MCG/ACT nasal spray; Place 2 sprays into both nostrils daily.   Dispense: 16 g; Refill: 6  3. Essential hypertension Still taking the Metoprolol and HCTZ daily. Feels anxious with ear pain today. Limit salt intake and recheck BP at home. Follow up as planned in 1 month.

## 2016-10-28 ENCOUNTER — Telehealth: Payer: Self-pay | Admitting: Family Medicine

## 2016-10-28 NOTE — Telephone Encounter (Signed)
Is she using flonase with amoxicillin? If not she should start this 2 sprays per nostril. Also she may need to use sudafed 42m every 4-6 hours x 2-3 days to help with congestion if she is already using flonase. I do not want her to take sudafed for too long of a period because it will increase her BP.   If she is concerned to take sudafed because it will affect her BP, she can try coricidin HBP instead. Just know if she takes coricidin to not take tylenol or tylenol products with it because it already has acetaminophen.

## 2016-10-28 NOTE — Telephone Encounter (Signed)
Patient advised as directed below. Patient was advised to call back if Sudafed does not help with congestion. Patient is taking Flonase with Amoxicillin as directed.

## 2016-10-28 NOTE — Telephone Encounter (Signed)
Pt saw Simona Huh on 10/26/16 for ear pain. Pt has been taking amoxicillin (AMOXIL) 875 MG tablet since 10/26/16 and her ear pain hasn't improved. Pt wanted to see if she should try anything else or what she should do. Pt was advised that Simona Huh was out of the office this afternoon. Pt wanted to see if anyone else could look into this. Please advise. Thanks TNP

## 2016-12-03 ENCOUNTER — Encounter: Payer: Self-pay | Admitting: Physician Assistant

## 2016-12-03 ENCOUNTER — Ambulatory Visit (INDEPENDENT_AMBULATORY_CARE_PROVIDER_SITE_OTHER): Payer: BC Managed Care – PPO | Admitting: Physician Assistant

## 2016-12-03 VITALS — BP 140/90 | HR 80 | Temp 98.1°F | Resp 16 | Wt 218.0 lb

## 2016-12-03 DIAGNOSIS — I10 Essential (primary) hypertension: Secondary | ICD-10-CM

## 2016-12-03 DIAGNOSIS — K219 Gastro-esophageal reflux disease without esophagitis: Secondary | ICD-10-CM | POA: Diagnosis not present

## 2016-12-03 HISTORY — DX: Gastro-esophageal reflux disease without esophagitis: K21.9

## 2016-12-03 MED ORDER — OMEPRAZOLE 20 MG PO CPDR: 20.0000 mg | DELAYED_RELEASE_CAPSULE | Freq: Every day | ORAL | 1 refills | Status: DC

## 2016-12-03 MED ORDER — TRIAMTERENE-HCTZ 37.5-25 MG PO TABS: 1.0000 | ORAL_TABLET | Freq: Every day | ORAL | 1 refills | Status: DC

## 2016-12-03 NOTE — Progress Notes (Signed)
Patient: Rachel Wells Female    DOB: 11/20/1971   45 y.o.   MRN: [id number] Visit Date: 12/03/2016  Today's Provider: Mar Daring, PA-C   Chief Complaint  Patient presents with  . Hypertension   Subjective:    HPI      Hypertension, follow-up:  BP Readings from Last 3 Encounters:  12/03/16 140/90  10/26/16 (!) 150/96  04/12/16 (!) 150/98    She was last seen for hypertension 4 weeks ago.  BP at that visit was 150/96. Management since that visit includes continuing Metoprolol, HCTZ and limiting salt intake. She reports excellent compliance with treatment. She is not having side effects. She is not exercising. She is adherent to low salt diet.   Outside blood pressures are 140's/ high 80's. She is experiencing lower extremity edema.  Patient denies chest pain, chest pressure/discomfort, claudication, dyspnea, exertional chest pressure/discomfort, fatigue, irregular heart beat, near-syncope, orthopnea, palpitations and syncope.   Cardiovascular risk factors include hypertension and obesity (BMI >= 30 kg/m2).      Weight trend: stable Wt Readings from Last 3 Encounters:  12/03/16 218 lb (98.9 kg)  10/26/16 220 lb 12.8 oz (100.2 kg)  04/12/16 206 lb (93.4 kg)    Current diet: in general, a "healthy" diet    ------------------------------------------------------------------------    Allergies  Allergen Reactions  . Neomycin-Bacitracin Zn-Polymyx     REACTION: rash, itch  . Shrimp [Shellfish Allergy] Swelling     Current Outpatient Prescriptions:  .  Cetirizine HCl 10 MG CAPS, Take 1 tablet by mouth daily., Disp: , Rfl:  .  fluticasone (FLONASE) 50 MCG/ACT nasal spray, Place 2 sprays into both nostrils daily., Disp: 16 g, Rfl: 6 .  guaiFENesin (MUCINEX) 600 MG 12 hr tablet, Take 600 mg by mouth 2 (two) times daily., Disp: , Rfl:  .  hydrochlorothiazide (HYDRODIURIL) 25 MG tablet, Take 1 tablet (25 mg total) by mouth daily., Disp: 90 tablet,  Rfl: 3 .  metoprolol succinate (TOPROL-XL) 50 MG 24 hr tablet, TAKE 1 TABLET BY MOUTH DAILY, Disp: 90 tablet, Rfl: 3 .  MIRENA, 52 MG, 20 MCG/24HR IUD, , Disp: , Rfl:  .  MULTIPLE VITAMIN PO, Take 1 tablet by mouth daily., Disp: , Rfl:   Review of Systems  Constitutional: Negative for activity change, appetite change, chills, diaphoresis, fatigue, fever and unexpected weight change.  Respiratory: Negative for cough, chest tightness, shortness of breath and wheezing.   Cardiovascular: Positive for leg swelling. Negative for chest pain and palpitations.  Gastrointestinal: Negative for abdominal pain.  Neurological: Negative for dizziness, light-headedness and headaches.    Social History  Substance Use Topics  . Smoking status: Never Smoker  . Smokeless tobacco: Not on file  . Alcohol use No   Objective:   BP 140/90 (BP Location: Right Arm, Patient Position: Sitting, Cuff Size: Large)   Pulse 80   Temp 98.1 F (36.7 C) (Oral)   Resp 16   Wt 218 lb (98.9 kg)   LMP 10/29/2016   BMI 38.01 kg/m   Physical Exam  Constitutional: She appears well-developed and well-nourished. No distress.  Neck: Normal range of motion. Neck supple. No JVD present. No tracheal deviation present. No thyromegaly present.  Cardiovascular: Normal rate, regular rhythm, normal heart sounds and intact distal pulses.  Exam reveals no gallop and no friction rub.   No murmur heard. Pulmonary/Chest: Effort normal and breath sounds normal. No respiratory distress. She has no wheezes. She has no rales.  Musculoskeletal: She exhibits edema (1+ non pitting edema bilaterally).  Lymphadenopathy:    She has no cervical adenopathy.  Skin: She is not diaphoretic.  Vitals reviewed.      Assessment & Plan:     1. Essential hypertension Still not quite to goal with lower extremity edema. Will change HCTZ to Maxzide 37-61m as below. She is to call if BP starts increasing again. I will see her back in 3 months and will  check labs at that time.  - triamterene-hydrochlorothiazide (MAXZIDE-25) 37.5-25 MG tablet; Take 1 tablet by mouth daily.  Dispense: 90 tablet; Refill: 1  2. Gastroesophageal reflux disease, esophagitis presence not specified Worsening. Advised to eat smaller meals, avoid spicy foods and foods with high acid content. Try to not eat late meals. Will give prilosec as below. She is to call if no improvement. I will see her back in 3 months to re-evaluate. - omeprazole (PRILOSEC) 20 MG capsule; Take 1 capsule (20 mg total) by mouth daily.  Dispense: 90 capsule; Refill: 1     Patient seen and examined by JMar Daring PA-C, and note scribed by ERenaldo Fiddler CMA.  JMar Daring PA-C  [facility]

## 2016-12-03 NOTE — Patient Instructions (Signed)
Hydrochlorothiazide, HCTZ; Triamterene tablets or capsules What is this medicine? HYDROCHLOROTHIAZIDE; TRIAMTERENE (hye droe klor oh THYE a zide; trye AM ter een) is a diuretic. It helps you make more urine and lose the extra water from your body. This medicine is used to treat high blood pressure and edema or swelling from excess water. This medicine may be used for other purposes; ask your health care provider or pharmacist if you have questions. COMMON BRAND NAME(S): Dyazide, Maxzide What should I tell my health care provider before I take this medicine? They need to know if you have any of these conditions: -diabetes -immune system problems, like lupus -kidney disease or stones -liver disease -small amount of urine or difficulty passing urine -an unusual or allergic reaction to triamterene, hydrochlorothiazide, sulfa drugs, other medicines, foods, dyes, or preservatives -pregnant or trying to get pregnant -breast-feeding How should I use this medicine? Take this medicine by mouth with a glass of water. Follow the directions on your prescription label. Take your medicine at regular intervals. Do not take it more often than directed. Do not stop taking except on your doctor's advice. Remember that you will need to pass urine frequently after taking this medicine. Do not take your doses at a time of day that will cause you problems. Do not take at bedtime. Talk to your pediatrician regarding the use of this medicine in children. Special care may be needed. Overdosage: If you think you have taken too much of this medicine contact a poison control center or emergency room at once. NOTE: This medicine is only for you. Do not share this medicine with others. What if I miss a dose? If you miss a dose, take it as soon as you can. If it is almost time for your next dose, take only that dose. Do not take double or extra doses. What may interact with this medicine? Do not take this medicine with any  of the following medications: -eplerenone This medicine may also interact with the following medications: -cyclosporine -heart medicines like ACE inhibitors, digoxin, dofetilide, eplerenone, angiotensin II antagonists, and medicines for blood pressure -lithium -medicines for diabetes -medicines for inflammation like indomethacin -medicines that relax muscles for surgery -other diuretics -potassium -sotalol -tacrolimus This list may not describe all possible interactions. Give your health care provider a list of all the medicines, herbs, non-prescription drugs, or dietary supplements you use. Also tell them if you smoke, drink alcohol, or use illegal drugs. Some items may interact with your medicine. What should I watch for while using this medicine? Visit your doctor or health care professional for regular check ups. You will need lab work done before you start this medicine and regularly while you are taking it. Check your blood pressure regularly. Ask your health care professional what your blood pressure should be, and when you should contact them. If you are a diabetic, check your blood sugar as directed. Do not stop taking your medicine unless your doctor tells you to. You may need to be on a special diet while taking this medicine. Ask your doctor. Also, ask how many glasses of fluid you need to drink a day. You must not get dehydrated. You may get drowsy or dizzy. Do not drive, use machinery, or do anything that needs mental alertness until you know how this medicine affects you. Do not stand or sit up quickly, especially if you are an older patient. This reduces the risk of dizzy or fainting spells. Alcohol may interfere with the effect  of this medicine. Avoid or limit alcoholic drinks. This medicine can make you more sensitive to the sun. Keep out of the sun. If you cannot avoid being in the sun, wear protective clothing and use sunscreen. Do not use sun lamps or tanning beds/booths. What  side effects may I notice from receiving this medicine? Side effects that you should report to your doctor or health care professional as soon as possible: -allergic reactions such as skin rash or itching, hives, swelling of the lips, mouth, tongue, or throat -changes in vision -eye pain -fast or irregular heartbeat, chest pain -feeling faint or dizzy -gout attack -muscle pain or cramps -numbness or tingling in hands, feet, or lips -pain or difficulty when passing urine -redness, blistering, peeling or loosening of the skin, including inside the mouth -shortness of breath -unusually weak or tired Side effects that usually do not require medical attention (report to your doctor or health care professional if they continue or are bothersome): -change in sex drive or performance -dry mouth -headache -stomach upset This list may not describe all possible side effects. Call your doctor for medical advice about side effects. You may report side effects to FDA at 1-800-FDA-1088. Where should I keep my medicine? Keep out of the reach of children. Store at room temperature between 15 and 30 degrees C (59 and 86 degrees F). Protect from light. Throw away any unused medicine after the expiration date. NOTE: This sheet is a summary. It may not cover all possible information. If you have questions about this medicine, talk to your doctor, pharmacist, or health care provider.  2017 Elsevier/Gold Standard (2010-08-19 14:54:04)

## 2017-01-03 ENCOUNTER — Other Ambulatory Visit: Payer: Self-pay

## 2017-01-03 ENCOUNTER — Telehealth: Payer: Self-pay | Admitting: Physician Assistant

## 2017-01-03 DIAGNOSIS — I10 Essential (primary) hypertension: Secondary | ICD-10-CM

## 2017-01-03 MED ORDER — METOPROLOL SUCCINATE ER 50 MG PO TB24: 50.0000 mg | ORAL_TABLET | Freq: Every day | ORAL | 3 refills | Status: DC

## 2017-01-03 NOTE — Telephone Encounter (Signed)
Patient requesting refill on metoprolol er sent to [facility]. Please review. Thank you. sd

## 2017-01-03 NOTE — Telephone Encounter (Signed)
Pt needs to get the   metoprolol succinate (TOPROL-XL) 50 MG 24 hr tablet  She said [facility] [address] said they sent for a PA but havent recd anything back from  [place]  Pt's call back is [phone]  Thanks teri

## 2017-03-04 ENCOUNTER — Encounter: Payer: Self-pay | Admitting: Physician Assistant

## 2017-03-04 ENCOUNTER — Ambulatory Visit (INDEPENDENT_AMBULATORY_CARE_PROVIDER_SITE_OTHER): Payer: BC Managed Care – PPO | Admitting: Physician Assistant

## 2017-03-04 VITALS — BP 142/90 | HR 100 | Temp 98.1°F | Resp 16 | Wt 226.0 lb

## 2017-03-04 DIAGNOSIS — R0602 Shortness of breath: Secondary | ICD-10-CM

## 2017-03-04 DIAGNOSIS — R002 Palpitations: Secondary | ICD-10-CM

## 2017-03-04 DIAGNOSIS — R600 Localized edema: Secondary | ICD-10-CM | POA: Diagnosis not present

## 2017-03-04 DIAGNOSIS — I10 Essential (primary) hypertension: Secondary | ICD-10-CM | POA: Diagnosis not present

## 2017-03-04 MED ORDER — LISINOPRIL-HYDROCHLOROTHIAZIDE 10-12.5 MG PO TABS: 1.0000 | ORAL_TABLET | Freq: Every day | ORAL | 1 refills | Status: DC

## 2017-03-04 NOTE — Progress Notes (Signed)
Patient: Rachel Wells Female    DOB: [birth year]   46 y.o.   MRN: [id number] Visit Date: 03/04/2017  Today's Provider: Mar Daring, PA-C   Chief Complaint  Patient presents with  . Hypertension  . Gastroesophageal Reflux   Subjective:    HPI      Hypertension, follow-up:  BP Readings from Last 3 Encounters:  03/04/17 (!) 142/90  12/03/16 140/90  10/26/16 (!) 150/96    She was last seen for hypertension 3 months ago.  BP at that visit was 140/90. Management since that visit includes changing HCTZ to Maxzide. She reports excellent compliance with treatment. She is having side effects. Palpitations and swelling. She is exercising. 30-45 minutes 4 days per week. Treadmill. She is adherent to low salt diet.   Outside blood pressures are 130's-140's/70's-80's. She is experiencing lower extremity edema, palpitations and dyspnea on exertion.  Patient denies chest pain, chest pressure/discomfort, claudication, exertional chest pressure/discomfort, irregular heart beat, near-syncope, orthopnea and syncope.   Cardiovascular risk factors include hypertension and obesity (BMI >= 30 kg/m2).    Weight trend: increasing steadily Wt Readings from Last 3 Encounters:  03/04/17 226 lb (102.5 kg)  12/03/16 218 lb (98.9 kg)  10/26/16 220 lb 12.8 oz (100.2 kg)    Current diet: in general, a "healthy" diet    ------------------------------------------------------------------------  GERD, Follow up:  The patient was last seen for GERD 3 months ago. Changes made since that visit include starting Omeprazole.  Pt has not started medication.  She IS experiencing no current symptoms. Pt reporst she changed jobs and her sx are improved.. She is NOT experiencing abdominal bloating, belching, chest pain, choking on food, cough, difficulty swallowing, dysphagia or heartburn  ------------------------------------------------------------------------    Allergies  Allergen  Reactions  . Neomycin-Bacitracin Zn-Polymyx     REACTION: rash, itch  . Shrimp [Shellfish Allergy] Swelling     Current Outpatient Prescriptions:  .  Cetirizine HCl 10 MG CAPS, Take 1 tablet by mouth daily., Disp: , Rfl:  .  fluticasone (FLONASE) 50 MCG/ACT nasal spray, Place 2 sprays into both nostrils daily., Disp: 16 g, Rfl: 6 .  guaiFENesin (MUCINEX) 600 MG 12 hr tablet, Take 600 mg by mouth 2 (two) times daily., Disp: , Rfl:  .  metoprolol succinate (TOPROL-XL) 50 MG 24 hr tablet, Take 1 tablet (50 mg total) by mouth daily. Take with or immediately following a meal., Disp: 90 tablet, Rfl: 3 .  MIRENA, 52 MG, 20 MCG/24HR IUD, , Disp: , Rfl:  .  MULTIPLE VITAMIN PO, Take 1 tablet by mouth daily., Disp: , Rfl:  .  triamterene-hydrochlorothiazide (MAXZIDE-25) 37.5-25 MG tablet, Take 1 tablet by mouth daily., Disp: 90 tablet, Rfl: 1 .  omeprazole (PRILOSEC) 20 MG capsule, Take 1 capsule (20 mg total) by mouth daily. (Patient not taking: Reported on 03/04/2017), Disp: 90 capsule, Rfl: 1  Review of Systems  Constitutional: Positive for fatigue and unexpected weight change. Negative for activity change, appetite change, chills, diaphoresis and fever.  Respiratory: Positive for shortness of breath (on exertion). Negative for cough, chest tightness and wheezing.   Cardiovascular: Positive for palpitations and leg swelling. Negative for chest pain.  Gastrointestinal: Negative for abdominal distention and abdominal pain.  Neurological: Negative for dizziness, weakness, numbness and headaches.    Social History  Substance Use Topics  . Smoking status: Never Smoker  . Smokeless tobacco: Never Used  . Alcohol use No   Objective:   BP (!) 142/90 (  BP Location: Left Arm, Patient Position: Sitting, Cuff Size: Large)   Pulse 100   Temp 98.1 F (36.7 C) (Oral)   Resp 16   Wt 226 lb (102.5 kg)   LMP 03/01/2017 Comment: IUD  BMI 39.41 kg/m  Vitals:   03/04/17 0813  BP: (!) 142/90  Pulse: 100   Resp: 16  Temp: 98.1 F (36.7 C)  TempSrc: Oral  Weight: 226 lb (102.5 kg)     Physical Exam  Constitutional: She appears well-developed and well-nourished. No distress.  Neck: Normal range of motion. Neck supple. No JVD present. No tracheal deviation present. No thyromegaly present.  Cardiovascular: Normal rate, regular rhythm and normal heart sounds.  Exam reveals no gallop and no friction rub.   No murmur heard. Pulmonary/Chest: Effort normal and breath sounds normal. No respiratory distress. She has no wheezes. She has no rales.  Abdominal: Soft. Bowel sounds are normal. She exhibits no distension and no mass. There is no tenderness. There is no rebound and no guarding.  Musculoskeletal: She exhibits no edema.  Lymphadenopathy:    She has no cervical adenopathy.  Skin: She is not diaphoretic.  Vitals reviewed.     Assessment & Plan:     1. SOB (shortness of breath) on exertion EKG today showed NSR rate of 90 reviewed by me. Suspect possible side effect with triamterene. Will change BP medication from maxzide to lisinopril-HCTZ as below. I will see her back in 3 months to recheck. She is to call is she has adverse reactions to this medication. She is also to keep a BP log of home readings and bring with her when she returns. - EKG 12-Lead  2. Palpitations See above medical treatment plan. - EKG 12-Lead  3. Bilateral lower extremity edema See above medical treatment plan. - EKG 12-Lead  4. Essential hypertension See above medical treatment plan. - lisinopril-hydrochlorothiazide (PRINZIDE,ZESTORETIC) 10-12.5 MG tablet; Take 1 tablet by mouth daily.  Dispense: 90 tablet; Refill: 1     Patient seen and examined by Mar Daring, PA-C, and note scribed by Renaldo Fiddler, CMA.  Mar Daring, PA-C  [facility]

## 2017-03-04 NOTE — Patient Instructions (Signed)
Hydrochlorothiazide, HCTZ; Lisinopril tablets What is this medicine? HYDROCHLOROTHIAZIDE; LISINOPRIL (hye droe klor oh THYE a zide; lyse IN oh pril) is a combination of a diuretic and an ACE inhibitor. It is used to treat high blood pressure. This medicine may be used for other purposes; ask your health care provider or pharmacist if you have questions. COMMON BRAND NAME(S): Prinzide, Zestoretic What should I tell my health care provider before I take this medicine? They need to know if you have any of these conditions: -bone marrow disease -decreased urine -heart or blood vessel disease -if you are on a special diet like a low salt diet -immune system problems, like lupus -kidney disease -liver disease -previous swelling of the tongue, face, or lips with difficulty breathing, difficulty swallowing, hoarseness, or tightening of the throat -recent heart attack or stroke -an unusual or allergic reaction to lisinopril, hydrochlorothiazide, sulfa drugs, other medicines, insect venom, foods, dyes, or preservatives -pregnant or trying to get pregnant -breast-feeding How should I use this medicine? Take this medicine by mouth with a glass of water. Follow the directions on the prescription label. You can take it with or without food. If it upsets your stomach, take it with food. Take your medicine at regular intervals. Do not take it more often than directed. Do not stop taking except on your doctor's advice. Talk to your pediatrician regarding the use of this medicine in children. Special care may be needed. Overdosage: If you think you have taken too much of this medicine contact a poison control center or emergency room at once. NOTE: This medicine is only for you. Do not share this medicine with others. What if I miss a dose? If you miss a dose, take it as soon as you can. If it is almost time for your next dose, take only that dose. Do not take double or extra doses. What may interact with  this medicine? Do not take this medication with any of the following medications: -sacubitril; valsartan This medicine may also interact with the following: -barbiturates like phenobarbital -blood pressure medicines -corticosteroids like prednisone -diabetic medications -diuretics, especially triamterene, spironolactone or amiloride -lithium -NSAIDs, medicines for pain and inflammation, like ibuprofen or naproxen -potassium salts or potassium supplements -prescription pain medicines -skeletal muscle relaxants like tubocurarine -some cholesterol lowering medications like cholestyramine or colestipol This list may not describe all possible interactions. Give your health care provider a list of all the medicines, herbs, non-prescription drugs, or dietary supplements you use. Also tell them if you smoke, drink alcohol, or use illegal drugs. Some items may interact with your medicine. What should I watch for while using this medicine? Visit your doctor or health care professional for regular checks on your progress. Check your blood pressure as directed. Ask your doctor or health care professional what your blood pressure should be and when you should contact him or her. Call your doctor or health care professional if you notice an irregular or fast heart beat. You must not get dehydrated. Ask your doctor or health care professional how much fluid you need to drink a day. Check with him or her if you get an attack of severe diarrhea, nausea and vomiting, or if you sweat a lot. The loss of too much body fluid can make it dangerous for you to take this medicine. Women should inform their doctor if they wish to become pregnant or think they might be pregnant. There is a potential for serious side effects to an unborn child. Talk to  your health care professional or pharmacist for more information. You may get drowsy or dizzy. Do not drive, use machinery, or do anything that needs mental alertness until  you know how this drug affects you. Do not stand or sit up quickly, especially if you are an older patient. This reduces the risk of dizzy or fainting spells. Alcohol can make you more drowsy and dizzy. Avoid alcoholic drinks. This medicine may affect your blood sugar level. If you have diabetes, check with your doctor or health care professional before changing the dose of your diabetic medicine. Avoid salt substitutes unless you are told otherwise by your doctor or health care professional. This medicine can make you more sensitive to the sun. Keep out of the sun. If you cannot avoid being in the sun, wear protective clothing and use sunscreen. Do not use sun lamps or tanning beds/booths. Do not treat yourself for coughs, colds, or pain while you are taking this medicine without asking your doctor or health care professional for advice. Some ingredients may increase your blood pressure. What side effects may I notice from receiving this medicine? Side effects that you should report to your doctor or health care professional as soon as possible: -changes in vision -confusion, dizziness, light headedness or fainting spells -decreased amount of urine passed -difficulty breathing or swallowing, hoarseness, or tightening of the throat -eye pain -fast or irregular heart beat, palpitations, or chest pain -muscle cramps -nausea and vomiting -persistent dry cough -redness, blistering, peeling or loosening of the skin, including inside the mouth -stomach pain -swelling of your face, lips, tongue, hands, or feet -unusual rash, bleeding or bruising, or pinpoint red spots on the skin -worsened gout pain -yellowing of the eyes or skin Side effects that usually do not require medical attention (report to your doctor or health care professional if they continue or are bothersome): -change in sex drive or performance -cough -headache This list may not describe all possible side effects. Call your doctor  for medical advice about side effects. You may report side effects to FDA at 1-800-FDA-1088. Where should I keep my medicine? Keep out of the reach of children. Store at room temperature between 20 and 25 degrees C (68 and 77 degrees F). Protect from moisture and excessive light. Keep container tightly closed. Throw away any unused medicine after the expiration date. NOTE: This sheet is a summary. It may not cover all possible information. If you have questions about this medicine, talk to your doctor, pharmacist, or health care provider.  2018 Elsevier/Gold Standard (2016-01-23 11:42:20)

## 2017-03-10 ENCOUNTER — Encounter: Payer: Self-pay | Admitting: Physician Assistant

## 2017-03-10 DIAGNOSIS — J0140 Acute pansinusitis, unspecified: Secondary | ICD-10-CM

## 2017-03-10 MED ORDER — AMOXICILLIN-POT CLAVULANATE 875-125 MG PO TABS: 1.0000 | ORAL_TABLET | Freq: Two times a day (BID) | ORAL | 0 refills | Status: DC

## 2017-03-10 MED ORDER — PREDNISONE 10 MG (21) PO TBPK: ORAL_TABLET | ORAL | 0 refills | Status: DC

## 2017-06-03 ENCOUNTER — Ambulatory Visit (INDEPENDENT_AMBULATORY_CARE_PROVIDER_SITE_OTHER): Payer: BC Managed Care – PPO | Admitting: Physician Assistant

## 2017-06-03 ENCOUNTER — Encounter: Payer: Self-pay | Admitting: Physician Assistant

## 2017-06-03 VITALS — BP 124/78 | HR 97 | Temp 97.8°F | Resp 16 | Wt 228.0 lb

## 2017-06-03 DIAGNOSIS — I10 Essential (primary) hypertension: Secondary | ICD-10-CM | POA: Diagnosis not present

## 2017-06-03 MED ORDER — LOSARTAN POTASSIUM-HCTZ 50-12.5 MG PO TABS: 1.0000 | ORAL_TABLET | Freq: Every day | ORAL | 1 refills | Status: DC

## 2017-06-03 NOTE — Progress Notes (Signed)
Patient: Rachel Wells Female    DOB: 1971/08/12   46 y.o.   MRN: [id number] Visit Date: 06/03/2017  Today's Provider: Mar Daring, PA-C   Chief Complaint  Patient presents with  . Hypertension   Subjective:    HPI  Hypertension, follow-up:  BP Readings from Last 3 Encounters:  06/03/17 124/78  03/04/17 (!) 142/90  12/03/16 140/90    She was last seen for hypertension 3 months ago.  BP at that visit was 142/90. Management since that visit includes d/c'd Maxzide and started lisinopril-HCTZ. She reports good compliance with treatment. She is having side effects. Possibly. She reports that about a month after the medication change she started having numbness in the center of her tongue making her not able to taste much, congestion, runny nose, watery eyes and a dry cough. She says it could be allergies too, so she's unsure.  She is exercising. She is adherent to low salt diet.   Outside blood pressures are being checked but she does not remember the numbers, but has not been high.. She is experiencing none.  Patient denies chest pain, chest pressure/discomfort, claudication, dyspnea, exertional chest pressure/discomfort, fatigue, irregular heart beat, lower extremity edema, near-syncope, orthopnea, palpitations, paroxysmal nocturnal dyspnea, syncope and tachypnea.   Cardiovascular risk factors include obesity (BMI >= 30 kg/m2).   Wt Readings from Last 3 Encounters:  06/03/17 228 lb (103.4 kg)  03/04/17 226 lb (102.5 kg)  12/03/16 218 lb (98.9 kg)   ------------------------------------------------------------------------      Allergies  Allergen Reactions  . Neomycin-Bacitracin Zn-Polymyx     REACTION: rash, itch  . Shrimp [Shellfish Allergy] Swelling     Current Outpatient Prescriptions:  .  Cetirizine HCl 10 MG CAPS, Take 1 tablet by mouth daily., Disp: , Rfl:  .  guaiFENesin (MUCINEX) 600 MG 12 hr tablet, Take 600 mg by mouth 2 (two) times daily.,  Disp: , Rfl:  .  lisinopril-hydrochlorothiazide (PRINZIDE,ZESTORETIC) 10-12.5 MG tablet, Take 1 tablet by mouth daily., Disp: 90 tablet, Rfl: 1 .  metoprolol succinate (TOPROL-XL) 50 MG 24 hr tablet, Take 1 tablet (50 mg total) by mouth daily. Take with or immediately following a meal., Disp: 90 tablet, Rfl: 3 .  MIRENA, 52 MG, 20 MCG/24HR IUD, , Disp: , Rfl:  .  MULTIPLE VITAMIN PO, Take 1 tablet by mouth daily., Disp: , Rfl:  .  fluticasone (FLONASE) 50 MCG/ACT nasal spray, Place 2 sprays into both nostrils daily. (Patient not taking: Reported on 06/03/2017), Disp: 16 g, Rfl: 6  Review of Systems  Constitutional: Negative.   HENT: Positive for congestion and rhinorrhea.   Eyes: Positive for discharge (watery).  Respiratory: Positive for cough.   Cardiovascular: Negative.   Gastrointestinal: Negative.        Indigestion  Endocrine: Negative.   Genitourinary: Negative.   Musculoskeletal: Positive for back pain (after she eats).  Allergic/Immunologic: Negative.   Neurological: Negative.   Hematological: Negative.   Psychiatric/Behavioral: Negative.     Social History  Substance Use Topics  . Smoking status: Never Smoker  . Smokeless tobacco: Never Used  . Alcohol use No   Objective:   BP 124/78 (BP Location: Left Arm, Patient Position: Sitting, Cuff Size: Large)   Pulse 97   Temp 97.8 F (36.6 C) (Oral)   Resp 16   Wt 228 lb (103.4 kg)   BMI 39.75 kg/m  Vitals:   06/03/17 1612  BP: 124/78  Pulse: 97  Resp: 16  Temp: 97.8 F (36.6 C)  TempSrc: Oral  Weight: 228 lb (103.4 kg)     Physical Exam  Constitutional: She appears well-developed and well-nourished. No distress.  HENT:  Head: Normocephalic and atraumatic.  Right Ear: Hearing, tympanic membrane, external ear and ear canal normal.  Left Ear: Hearing, tympanic membrane, external ear and ear canal normal.  Nose: Nose normal.  Mouth/Throat: Uvula is midline, oropharynx is clear and moist and mucous membranes  are normal. No oropharyngeal exudate.  Eyes: Conjunctivae are normal. Pupils are equal, round, and reactive to light. Right eye exhibits no discharge. Left eye exhibits no discharge. No scleral icterus.  Neck: Normal range of motion. Neck supple. No tracheal deviation present. No thyromegaly present.  Cardiovascular: Normal rate, regular rhythm, normal heart sounds and intact distal pulses.  Exam reveals no gallop and no friction rub.   No murmur heard. Pulmonary/Chest: Effort normal and breath sounds normal. No stridor. No respiratory distress. She has no wheezes. She has no rales.  Musculoskeletal: She exhibits no edema.  Lymphadenopathy:    She has no cervical adenopathy.  Skin: Skin is warm and dry. She is not diaphoretic.  Vitals reviewed.     Assessment & Plan:     1. Essential hypertension She had adverse reaction when adding the lisinopril (no angioedema). BP is at goal now but since she is having side effects we will change her to losartan-HCTZ as below. I will see her back in 2 months to recheck her BP.  - losartan-hydrochlorothiazide (HYZAAR) 50-12.5 MG tablet; Take 1 tablet by mouth daily.  Dispense: 90 tablet; Refill: [place], PA-C  [facility]

## 2017-06-03 NOTE — Patient Instructions (Signed)
Hydrochlorothiazide, HCTZ; Losartan tablets What is this medicine? LOSARTAN; HYDROCHLOROTHIAZIDE (loe SAR tan; hye droe klor oh THYE a zide) is a combination of a drug that relaxes blood vessels and a diuretic. It is used to treat high blood pressure. This medicine may also reduce the risk of stroke in certain patients. This medicine may be used for other purposes; ask your health care provider or pharmacist if you have questions. COMMON BRAND NAME(S): Hyzaar What should I tell my health care provider before I take this medicine? They need to know if you have any of these conditions: -decreased urine -kidney disease -liver disease -if you are on a special diet, like a low-salt diet -immune system problems, like lupus -an unusual or allergic reaction to losartan, hydrochlorothiazide, sulfa drugs, other medicines, foods, dyes, or preservatives -pregnant or trying to get pregnant -breast-feeding How should I use this medicine? Take this medicine by mouth with a glass of water. Follow the directions on the prescription label. You can take it with or without food. If it upsets your stomach, take it with food. Take your medicine at regular intervals. Do not take it more often than directed. Do not stop taking except on your doctor's advice. Talk to your pediatrician regarding the use of this medicine in children. Special care may be needed. Overdosage: If you think you have taken too much of this medicine contact a poison control center or emergency room at once. NOTE: This medicine is only for you. Do not share this medicine with others. What if I miss a dose? If you miss a dose, take it as soon as you can. If it is almost time for your next dose, take only that dose. Do not take double or extra doses. What may interact with this medicine? -barbiturates, like phenobarbital -blood pressure medicines -celecoxib -cimetidine -corticosteroids -diabetic medicines -diuretics, especially triamterene,  spironolactone or amiloride -fluconazole -lithium -NSAIDs, medicines for pain and inflammation, like ibuprofen or naproxen -potassium salts or potassium supplements -prescription pain medicines -rifampin -skeletal muscle relaxants like tubocurarine -some cholesterol-lowering medicines like cholestyramine or colestipol This list may not describe all possible interactions. Give your health care provider a list of all the medicines, herbs, non-prescription drugs, or dietary supplements you use. Also tell them if you smoke, drink alcohol, or use illegal drugs. Some items may interact with your medicine. What should I watch for while using this medicine? Check your blood pressure regularly while you are taking this medicine. Ask your doctor or health care professional what your blood pressure should be and when you should contact him or her. When you check your blood pressure, write down the measurements to show your doctor or health care professional. If you are taking this medicine for a long time, you must visit your health care professional for regular checks on your progress. Make sure you schedule appointments on a regular basis. You must not get dehydrated. Ask your doctor or health care professional how much fluid you need to drink a day. Check with him or her if you get an attack of severe diarrhea, nausea and vomiting, or if you sweat a lot. The loss of too much body fluid can make it dangerous for you to take this medicine. Women should inform their doctor if they wish to become pregnant or think they might be pregnant. There is a potential for serious side effects to an unborn child, particularly in the second or third trimester. Talk to your health care professional or pharmacist for more information.  You may get drowsy or dizzy. Do not drive, use machinery, or do anything that needs mental alertness until you know how this drug affects you. Do not stand or sit up quickly, especially if you are  an older patient. This reduces the risk of dizzy or fainting spells. Alcohol can make you more drowsy and dizzy. Avoid alcoholic drinks. This medicine may affect your blood sugar level. If you have diabetes, check with your doctor or health care professional before changing the dose of your diabetic medicine. Avoid salt substitutes unless you are told otherwise by your doctor or health care professional. Do not treat yourself for coughs, colds, or pain while you are taking this medicine without asking your doctor or health care professional for advice. Some ingredients may increase your blood pressure. What side effects may I notice from receiving this medicine? Side effects that you should report to your doctor or health care professional as soon as possible: -allergic reactions like skin rash, itching or hives, swelling of the face, lips, or tongue -breathing problems -changes in vision -dark urine -eye pain -fast or irregular heart beat, palpitations, or chest pain -feeling faint or lightheaded -muscle cramps -persistent dry cough -redness, blistering, peeling or loosening of the skin, including inside the mouth -stomach pain -trouble passing urine or change in the amount of urine -unusual bleeding or bruising -worsened gout pain -yellowing of the eyes or skin Side effects that usually do not require medical attention (report to your doctor or health care professional if they continue or are bothersome): -change in sex drive or performance -headache This list may not describe all possible side effects. Call your doctor for medical advice about side effects. You may report side effects to FDA at 1-800-FDA-1088. Where should I keep my medicine? Keep out of the reach of children. Store at room temperature between 15 and 30 degrees C (59 and 86 degrees F). Protect from light. Keep container tightly closed. Throw away any unused medicine after the expiration date. NOTE: This sheet is a  summary. It may not cover all possible information. If you have questions about this medicine, talk to your doctor, pharmacist, or health care provider.  2018 Elsevier/Gold Standard (2010-08-19 13:57:32)

## 2017-06-07 ENCOUNTER — Emergency Department: Payer: BC Managed Care – PPO

## 2017-06-07 ENCOUNTER — Emergency Department
Admission: EM | Admit: 2017-06-07 | Discharge: 2017-06-07 | Disposition: A | Discharge status: Home / Self Care | Payer: BC Managed Care – PPO | Attending: Emergency Medicine | Admitting: Emergency Medicine

## 2017-06-07 ENCOUNTER — Encounter: Payer: Self-pay | Admitting: Emergency Medicine

## 2017-06-07 DIAGNOSIS — R110 Nausea: Secondary | ICD-10-CM

## 2017-06-07 DIAGNOSIS — R1013 Epigastric pain: Secondary | ICD-10-CM | POA: Diagnosis present

## 2017-06-07 DIAGNOSIS — K839 Disease of biliary tract, unspecified: Secondary | ICD-10-CM | POA: Insufficient documentation

## 2017-06-07 DIAGNOSIS — R1011 Right upper quadrant pain: Secondary | ICD-10-CM | POA: Insufficient documentation

## 2017-06-07 DIAGNOSIS — I10 Essential (primary) hypertension: Secondary | ICD-10-CM | POA: Insufficient documentation

## 2017-06-07 DIAGNOSIS — Z79899 Other long term (current) drug therapy: Secondary | ICD-10-CM | POA: Diagnosis not present

## 2017-06-07 DIAGNOSIS — K8050 Calculus of bile duct without cholangitis or cholecystitis without obstruction: Secondary | ICD-10-CM

## 2017-06-07 HISTORY — DX: Essential (primary) hypertension: I10

## 2017-06-07 LAB — COMPREHENSIVE METABOLIC PANEL
ALBUMIN: 4.1 g/dL (ref 3.5–5.0)
ALK PHOS: 83 U/L (ref 38–126)
ALT: 28 U/L (ref 14–54)
AST: 21 U/L (ref 15–41)
Anion gap: 9 (ref 5–15)
BUN: 12 mg/dL (ref 6–20)
CALCIUM: 9.3 mg/dL (ref 8.9–10.3)
CHLORIDE: 103 mmol/L (ref 101–111)
CO2: 25 mmol/L (ref 22–32)
CREATININE: 0.98 mg/dL (ref 0.44–1.00)
GFR calc Af Amer: >60 mL/min (ref >60)
GFR calc non Af Amer: >60 mL/min (ref >60)
GLUCOSE: 89 mg/dL (ref 65–99)
Potassium: 3.6 mmol/L (ref 3.5–5.1)
SODIUM: 137 mmol/L (ref 135–145)
Total Bilirubin: 0.6 mg/dL (ref 0.3–1.2)
Total Protein: 7.7 g/dL (ref 6.5–8.1)

## 2017-06-07 LAB — CBC
HCT: 40.4 % (ref 35.0–47.0)
Hemoglobin: 14.1 g/dL (ref 12.0–16.0)
MCH: 29.8 pg (ref 26.0–34.0)
MCHC: 34.9 g/dL (ref 32.0–36.0)
MCV: 85.2 fL (ref 80.0–100.0)
PLATELETS: 293 10*3/uL (ref 150–440)
RBC: 4.74 MIL/uL (ref 3.80–5.20)
RDW: 13.9 % (ref 11.5–14.5)
WBC: 9.6 10*3/uL (ref 3.6–11.0)

## 2017-06-07 LAB — URINALYSIS, COMPLETE (UACMP) WITH MICROSCOPIC
Bacteria, UA: NONE SEEN
Bilirubin Urine: NEGATIVE
GLUCOSE, UA: NEGATIVE mg/dL
Hgb urine dipstick: NEGATIVE
KETONES UR: NEGATIVE mg/dL
Leukocytes, UA: NEGATIVE
Nitrite: NEGATIVE
PH: 6 (ref 5.0–8.0)
PROTEIN: NEGATIVE mg/dL
Specific Gravity, Urine: 1.008 (ref 1.005–1.030)

## 2017-06-07 LAB — LIPASE, BLOOD: LIPASE: 38 U/L (ref 11–51)

## 2017-06-07 MED ORDER — ONDANSETRON 4 MG PO TBDP: ORAL_TABLET | ORAL | 0 refills | Status: DC

## 2017-06-07 MED ORDER — HYDROCODONE-ACETAMINOPHEN 5-325 MG PO TABS: 1.0000 | ORAL_TABLET | ORAL | 0 refills | Status: DC | PRN

## 2017-06-07 MED ORDER — OMEPRAZOLE MAGNESIUM 20 MG PO TBEC: 20.0000 mg | DELAYED_RELEASE_TABLET | Freq: Every day | ORAL | 1 refills | Status: DC

## 2017-06-07 MED ORDER — ONDANSETRON 4 MG PO TBDP
4.0000 mg | ORAL_TABLET | Freq: Once | ORAL | Status: AC
  Administered 2017-06-07: 4 mg via ORAL
  Filled 2017-06-07: qty 1

## 2017-06-07 NOTE — ED Provider Notes (Signed)
[facility] Emergency Department Provider Note  ____________________________________________   First MD Initiated Contact with Patient 06/07/17 1613     (approximate)  I have reviewed the triage vital signs and the nursing notes.   HISTORY  Chief Complaint Abdominal Pain and Nausea    HPI Rachel Wells is a 46 y.o. female with medical history as listed below and no prior surgical history who presents for evaluation of about 3 days of persistent epigastric and right upper quadrant abdominal pain accompanied with nausea.  She states that she had the pain when she awoke 3 days ago and it has not gotten any better.  It is mild at this time but annoying because it has continued.  She has had only one episode of vomiting but has also had decreased appetite.  When she does try to eat the pain gets worse and seems to radiate through to her back.  She has had this type of pain multiple times in the past, particularly the pain that is worse in her back after she eats and these symptoms were attributed to acid reflux by the patient and by her memory care doctor.  Nothing in particular is making her symptoms better now.  She denies fever/chills, chest pain, shortness of breath, lower abdominal pain, dysuria.   Past Medical History:  Diagnosis Date  . Hypertension     Patient Active Problem List   Diagnosis Date Noted  . Gastroesophageal reflux disease 12/03/2016  . Fatigue 04/18/2015  . Fibroid 04/18/2015  . BP (high blood pressure) 03/22/2008  . Headache, migraine 03/22/2008    Past Surgical History:  Procedure Laterality Date  . DILATION AND CURETTAGE OF UTERUS  1992  . TONSILLECTOMY AND ADENOIDECTOMY      Prior to Admission medications   Medication Sig Start Date End Date Taking? Authorizing Provider  Cetirizine HCl 10 MG CAPS Take 1 tablet by mouth daily.    [provider]  fluticasone (FLONASE) 50 MCG/ACT nasal spray Place 2 sprays into both  nostrils daily. Patient not taking: Reported on 06/03/2017 10/26/16   Chrismon, Vickki Muff, PA  guaiFENesin (MUCINEX) 600 MG 12 hr tablet Take 600 mg by mouth 2 (two) times daily.    [provider]  HYDROcodone-acetaminophen (NORCO/VICODIN) 5-325 MG tablet Take 1-2 tablets by mouth every 4 (four) hours as needed for moderate pain. 06/07/17   Hinda Kehr, MD  losartan-hydrochlorothiazide (HYZAAR) 50-12.5 MG tablet Take 1 tablet by mouth daily. 06/03/17   Mar Daring, PA-C  metoprolol succinate (TOPROL-XL) 50 MG 24 hr tablet Take 1 tablet (50 mg total) by mouth daily. Take with or immediately following a meal. 01/03/17   [place], Clearnce Sorrel, PA-C  MIRENA, 52 MG, 20 MCG/24HR IUD  03/25/16   [provider]  MULTIPLE VITAMIN PO Take 1 tablet by mouth daily.    [provider]  omeprazole (PRILOSEC OTC) 20 MG tablet Take 1 tablet (20 mg total) by mouth daily. 06/07/17 06/07/18  Hinda Kehr, MD  ondansetron (ZOFRAN ODT) 4 MG disintegrating tablet Allow 1-2 tablets to dissolve in your mouth every 8 hours as needed for nausea/vomiting 06/07/17   Hinda Kehr, MD    Allergies Neomycin-bacitracin zn-polymyx and Shrimp [shellfish allergy]  Family History  Problem Relation Age of Onset  . Fibromyalgia Mother   . Cancer Mother 41       breast cancer  . Hypertension Father   . Diabetes Brother   . Hypertension Paternal Grandmother   . Stroke  Paternal Grandmother   . Heart disease Paternal Grandfather     Social History Social History  Substance Use Topics  . Smoking status: Never Smoker  . Smokeless tobacco: Never Used  . Alcohol use No    Review of Systems Constitutional: No fever/chills Eyes: No visual changes. ENT: No sore throat. Cardiovascular: Denies chest pain. Respiratory: Denies shortness of breath. Gastrointestinal: 3 days of upper middle and right sided abdominal pain with nausea and one episode of emesis, worst after eating Genitourinary:  Negative for dysuria. Musculoskeletal: Negative for neck pain.  Abdominal pain is radiating through to her back, otherwise no back pain. Integumentary: Negative for rash. Neurological: Negative for headaches, focal weakness or numbness.   ____________________________________________   PHYSICAL EXAM:  ED Triage Vitals  Enc Vitals Group     BP 06/07/17 1630 123/77     Pulse Rate 06/07/17 1630 83     Resp 06/07/17 1630 16     Temp --      Temp src --      SpO2 06/07/17 1630 98 %     Weight 06/07/17 1439 103.4 kg (228 lb)     Height 06/07/17 1439 1.6 m (5' 3" )     Head Circumference --      Peak Flow --      Pain Score 06/07/17 1439 6     Pain Loc --      Pain Edu? --      Excl. in [place]? --      Constitutional: Alert and oriented. Well appearing and in no acute distress. Eyes: Conjunctivae are normal.  Head: Atraumatic. Nose: No congestion/rhinnorhea. Mouth/Throat: Mucous membranes are moist. Neck: No stridor.  No meningeal signs.   Cardiovascular: Normal rate, regular rhythm. Good peripheral circulation. Grossly normal heart sounds. Respiratory: Normal respiratory effort.  No retractions. Lungs CTAB. Gastrointestinal: Obese. Soft with severe tenderness palpation of the right upper quadrant with positive Murphy sign.  Mild to moderate tenderness to palpation of the epigastrium.  No lower abdominal tenderness, no rebound and no guarding Musculoskeletal: No lower extremity tenderness nor edema. No gross deformities of extremities. Neurologic:  Normal speech and language. No gross focal neurologic deficits are appreciated.  Skin:  Skin is warm, dry and intact. No rash noted. Psychiatric: Mood and affect are normal. Speech and behavior are normal.  ____________________________________________   LABS (all labs ordered are listed, but only abnormal results are displayed)  Labs Reviewed  URINALYSIS, COMPLETE (UACMP) WITH MICROSCOPIC - Abnormal; Notable for the following:        Result Value   Color, Urine YELLOW (*)    APPearance CLEAR (*)    Squamous Epithelial / LPF 0-5 (*)    All other components within normal limits  LIPASE, BLOOD  COMPREHENSIVE METABOLIC PANEL  CBC   ____________________________________________  EKG  None - EKG not ordered by ED physician ____________________________________________  RADIOLOGY   US Abdomen Limited Ruq  Result Date: 06/07/2017 CLINICAL DATA:  Right upper quadrant and epigastric pain radiating to the back, worse after eating, some nausea and vomiting EXAM: ULTRASOUND ABDOMEN LIMITED RIGHT UPPER QUADRANT COMPARISON:  None. FINDINGS: Gallbladder: The gallbladder is well visualized and has some gallbladder sludge present. No gallstones are seen. No gallbladder wall thickening is noted. Common bile duct: Diameter: The common bile duct is normal measuring 2.9 mm in diameter. Liver: The liver is echogenic and difficult to penetrate, consistent with diffuse fatty infiltration. No focal hepatic abnormality is seen. IMPRESSION: 1. No gallstones. 2. Echogenic  hepatic parenchyma consistent with fatty infiltration Electronically Signed   By: Ivar Drape M.D.   On: 06/07/2017 17:15    ____________________________________________   PROCEDURES  Critical Care performed: No   Procedure(s) performed:   Procedures   ____________________________________________   INITIAL IMPRESSION / ASSESSMENT AND PLAN / ED COURSE  Pertinent labs & imaging results that were available during my care of the patient were reviewed by me and considered in my medical decision making (see chart for details).  Signs and symptoms are consistent with biliary colic.  Vital signs are normal and labs are unremarkable.  Patient declines pain medicine at this time.  I will obtain an ultrasound and reassess.    (Note that documentation was delayed due to multiple ED patients requiring immediate care.)   The patient still denied having any significant  amount of pain after the ultrasound is back.  It was notable for sludge in the gallbladder but no obvious stones and no sign of cholecystitis.  I explained recommendation for outpatient follow-up with surgery and I am treating her as biliary colic because that is very consistent with her symptoms and she does have the sludge in the gallbladder.  I gave my usual and customary return precautions.  She understands and agrees with the plan.  ____________________________________________  FINAL CLINICAL IMPRESSION(S) / ED DIAGNOSES  Final diagnoses:  Biliary colic  Nausea  RUQ abdominal pain     MEDICATIONS GIVEN DURING THIS VISIT:  Medications  ondansetron (ZOFRAN-ODT) disintegrating tablet 4 mg (4 mg Oral Given 06/07/17 1736)     NEW OUTPATIENT MEDICATIONS STARTED DURING THIS VISIT:  Discharge Medication List as of 06/07/2017  5:41 PM    START taking these medications   Details  HYDROcodone-acetaminophen (NORCO/VICODIN) 5-325 MG tablet Take 1-2 tablets by mouth every 4 (four) hours as needed for moderate pain., Starting Tue 06/07/2017, Print    omeprazole (PRILOSEC OTC) 20 MG tablet Take 1 tablet (20 mg total) by mouth daily., Starting Tue 06/07/2017, Until Wed 06/07/2018, Print    ondansetron (ZOFRAN ODT) 4 MG disintegrating tablet Allow 1-2 tablets to dissolve in your mouth every 8 hours as needed for nausea/vomiting, Print        Discharge Medication List as of 06/07/2017  5:41 PM      Discharge Medication List as of 06/07/2017  5:41 PM       Note:  This document was prepared using Dragon voice recognition software and may include unintentional dictation errors.    Hinda Kehr, MD 06/07/17 2011

## 2017-06-07 NOTE — Discharge Instructions (Signed)
You have been seen in the Emergency Department (ED) for abdominal pain.  Your evaluation suggests that your pain is caused by sludge in your gallbladder.  Fortunately you do not need immediate surgery at this time, but it is important that you follow up with a surgeon as an outpatient; typically surgical removal of the gallbladder is the only thing that will definitively fix your issue if they agree this is the cause of your symptoms.  Read through the included information about a bland diet, and use any prescribed medications as instructed.  Avoid smoking and alcohol use.  Please follow up as instructed above regarding today?s emergent visit and the symptoms that are bothering you.  Take Norco as prescribed. Do not drink alcohol, drive or participate in any other potentially dangerous activities while taking this medication as it may make you sleepy. Do not take this medication with any other sedating medications, either prescription or over-the-counter. If you were prescribed Percocet or Vicodin, do not take these with acetaminophen (Tylenol) as it is already contained within these medications.   This medication is an opiate (or narcotic) pain medication and can be habit forming.  Use it as little as possible to achieve adequate pain control.  Do not use or use it with extreme caution if you have a history of opiate abuse or dependence.  If you are on a pain contract with your primary care doctor or a pain specialist, be sure to let them know you were prescribed this medication today from the [facility] Emergency Department.  This medication is intended for your use only - do not give any to anyone else and keep it in a secure place where nobody else, especially children, have access to it.  It will also cause or worsen constipation, so you may want to consider taking an over-the-counter stool softener while you are taking this medication.  Return to the ED if your abdominal pain worsens or fails to  improve, you develop bloody vomiting, bloody diarrhea, you are unable to tolerate fluids due to vomiting, fever greater than 101, or other symptoms that concern you.

## 2017-06-07 NOTE — ED Triage Notes (Signed)
Pt here with c/o right mid abdominal pain that began on Sunday with nausea. States she had one episode of vomiting this am and it was bile. States moving makes her pain worse. Appears in NAD.

## 2017-06-09 ENCOUNTER — Ambulatory Visit (INDEPENDENT_AMBULATORY_CARE_PROVIDER_SITE_OTHER): Payer: BC Managed Care – PPO | Admitting: Surgery

## 2017-06-09 ENCOUNTER — Encounter: Payer: Self-pay | Admitting: Surgery

## 2017-06-09 VITALS — BP 120/80 | HR 111 | Temp 97.3°F | Ht 64.0 in | Wt 224.0 lb

## 2017-06-09 DIAGNOSIS — G8929 Other chronic pain: Secondary | ICD-10-CM | POA: Diagnosis not present

## 2017-06-09 DIAGNOSIS — R1013 Epigastric pain: Secondary | ICD-10-CM

## 2017-06-09 NOTE — Addendum Note (Signed)
Addended by: Wayna Chalet on: 06/09/2017 11:58 AM   Modules accepted: Orders

## 2017-06-09 NOTE — Progress Notes (Signed)
Rachel Wells is an 46 y.o. female.   Chief Complaint: Right upper quadrant pain HPI: This patient was in the emergency room 2 days ago with right upper quadrant pain and a workup failed to identify gallstones. Patient describes epigastric and right upper quadrant pain sometimes radiating through to her back this been going on since December. That that time it was attributed to nerves by her primary care physician. She is on omeprazole. This has not made much difference. What is noted is that she often has pain in the morning when she wakes up on an empty stomach. She points that pain to her epigastrium. She chills or weight loss.  Her sister had a cholecystectomy  She works in the school system at a desk does not smoke or drink  Past Medical History:  Diagnosis Date  . BP (high blood pressure) 03/22/2008  . Fatigue 04/18/2015  . Fibroid 04/18/2015  . Gastroesophageal reflux disease 12/03/2016  . Headache, migraine 03/22/2008  . Hypertension     Past Surgical History:  Procedure Laterality Date  . DILATION AND CURETTAGE OF UTERUS  1992  . TONSILLECTOMY AND ADENOIDECTOMY      Family History  Problem Relation Age of Onset  . Fibromyalgia Mother   . Cancer Mother 33       breast cancer  . Hypertension Father   . Diabetes Brother   . Hypertension Paternal Grandmother   . Stroke Paternal Grandmother   . Heart disease Paternal Grandfather    Social History:  reports that she has never smoked. She has never used smokeless tobacco. She reports that she does not drink alcohol or use drugs.  Allergies:  Allergies  Allergen Reactions  . Shrimp [Shellfish Allergy] Swelling  . Neomycin-Bacitracin Zn-Polymyx Itching and Rash    REACTION: rash, itch     (Not in a hospital admission)   Review of Systems:   Review of Systems  Constitutional: Negative.   HENT: Negative.   Eyes: Negative.   Respiratory: Negative.   Cardiovascular: Negative.   Gastrointestinal: Positive for  abdominal pain, heartburn, nausea and vomiting. Negative for blood in stool, constipation, diarrhea and melena.  Genitourinary: Negative.   Musculoskeletal: Negative.   Skin: Negative.   Neurological: Negative.   Endo/Heme/Allergies: Negative.   Psychiatric/Behavioral: Negative.     Physical Exam:  Physical Exam  Constitutional: She is oriented to person, place, and time and well-developed, well-nourished, and in no distress. No distress.  Obese  HENT:  Head: Normocephalic and atraumatic.  Eyes: Right eye exhibits no discharge. Left eye exhibits no discharge. No scleral icterus.  Neck: Normal range of motion.  Cardiovascular: Normal rate, regular rhythm and normal heart sounds.   Pulmonary/Chest: Effort normal and breath sounds normal. No respiratory distress. She has no wheezes. She has no rales.  Abdominal: Soft. Bowel sounds are normal. She exhibits no distension. There is no tenderness. There is no rebound and no guarding.  Negative Murphy sign  Musculoskeletal: Normal range of motion. She exhibits no edema, tenderness or deformity.  Lymphadenopathy:    She has no cervical adenopathy.  Neurological: She is alert and oriented to person, place, and time.  Skin: Skin is warm and dry. No rash noted. She is not diaphoretic. No erythema.  Psychiatric: Mood and affect normal.  Vitals reviewed.   There were no vitals taken for this visit.    Results for orders placed or performed during the hospital encounter of 06/07/17 (from the past 48 hour(s))  Lipase, blood  Status: None   Collection Time: 06/07/17  2:38 PM  Result Value Ref Range   Lipase 38 11 - 51 U/L  Comprehensive metabolic panel     Status: None   Collection Time: 06/07/17  2:38 PM  Result Value Ref Range   Sodium 137 135 - 145 mmol/L   Potassium 3.6 3.5 - 5.1 mmol/L   Chloride 103 101 - 111 mmol/L   CO2 25 22 - 32 mmol/L   Glucose, Bld 89 65 - 99 mg/dL   BUN 12 6 - 20 mg/dL   Creatinine, Ser 0.98 0.44 -  1.00 mg/dL   Calcium 9.3 8.9 - 10.3 mg/dL   Total Protein 7.7 6.5 - 8.1 g/dL   Albumin 4.1 3.5 - 5.0 g/dL   AST 21 15 - 41 U/L   ALT 28 14 - 54 U/L   Alkaline Phosphatase 83 38 - 126 U/L   Total Bilirubin 0.6 0.3 - 1.2 mg/dL   GFR calc non Af Amer >60 >60 mL/min   GFR calc Af Amer >60 >60 mL/min    Comment: (NOTE) The eGFR has been calculated using the CKD EPI equation. This calculation has not been validated in all clinical situations. eGFR's persistently <60 mL/min signify possible Chronic Kidney Disease.    Anion gap 9 5 - 15  CBC     Status: None   Collection Time: 06/07/17  2:38 PM  Result Value Ref Range   WBC 9.6 3.6 - 11.0 K/uL   RBC 4.74 3.80 - 5.20 MIL/uL   Hemoglobin 14.1 12.0 - 16.0 g/dL   HCT 40.4 35.0 - 47.0 %   MCV 85.2 80.0 - 100.0 fL   MCH 29.8 26.0 - 34.0 pg   MCHC 34.9 32.0 - 36.0 g/dL   RDW 13.9 11.5 - 14.5 %   Platelets 293 150 - 440 K/uL  Urinalysis, Complete w Microscopic     Status: Abnormal   Collection Time: 06/07/17  2:38 PM  Result Value Ref Range   Color, Urine YELLOW (A) YELLOW   APPearance CLEAR (A) CLEAR   Specific Gravity, Urine 1.008 1.005 - 1.030   pH 6.0 5.0 - 8.0   Glucose, UA NEGATIVE NEGATIVE mg/dL   Hgb urine dipstick NEGATIVE NEGATIVE   Bilirubin Urine NEGATIVE NEGATIVE   Ketones, ur NEGATIVE NEGATIVE mg/dL   Protein, ur NEGATIVE NEGATIVE mg/dL   Nitrite NEGATIVE NEGATIVE   Leukocytes, UA NEGATIVE NEGATIVE   RBC / HPF 0-5 0 - 5 RBC/hpf   WBC, UA 0-5 0 - 5 WBC/hpf   Bacteria, UA NONE SEEN NONE SEEN   Squamous Epithelial / LPF 0-5 (A) NONE SEEN   Mucous PRESENT    US Abdomen Limited Ruq  Result Date: 06/07/2017 CLINICAL DATA:  Right upper quadrant and epigastric pain radiating to the back, worse after eating, some nausea and vomiting EXAM: ULTRASOUND ABDOMEN LIMITED RIGHT UPPER QUADRANT COMPARISON:  None. FINDINGS: Gallbladder: The gallbladder is well visualized and has some gallbladder sludge present. No gallstones are seen.  No gallbladder wall thickening is noted. Common bile duct: Diameter: The common bile duct is normal measuring 2.9 mm in diameter. Liver: The liver is echogenic and difficult to penetrate, consistent with diffuse fatty infiltration. No focal hepatic abnormality is seen. IMPRESSION: 1. No gallstones. 2. Echogenic hepatic parenchyma consistent with fatty infiltration Electronically Signed   By: Ivar Drape M.D.   On: 06/07/2017 17:15     Assessment/Plan Ultrasound shows minimal sludge no gallstones and no pericholecystic fluid or  wall thickening. White blood cell count is normal and LFTs are normal. This patient with right upper quadrant pain but she also has postprandial pain. On occasion and quite frequently she has pain in the morning on an empty stomach. I believe that she has probably 2 problems as many of her symptoms sound like gallbladder disease without signs of gallstones. Sludge can cause this problem but I'm reluctant to offer surgical intervention until we are sure that this is her gallbladder. Obtain a HIDA scan with CCK and a GI consultation at this time. Florene Glen, MD, FACS

## 2017-06-09 NOTE — Patient Instructions (Signed)
We will send you to see a Gastroenterologist so they could do a HIDA Scan.  We will then see you back to know how you are doing. Then we will decide if you need your gallbladder removed or not.   Gallbladder Nuclear Scan A gallbladder nuclear scan (hepatobiliary scan or HIDA scan) is an imaging test that checks the function of your liver, your gallbladder, and the ducts of those organs. These parts make up your hepatobiliary system. You may need this scan if you have symptoms of liver or gallbladder disease. This scan is done with a camera that detects radioactive energy (gamma rays). For this exam, you will be given a radioactive substance, called a radiotracer, through an IV tube inserted in your hand or arm. As the radiotracer moves through your hepatobiliary system, the camera and a computer detect the gamma rays and form them into images that show how well your system is working. Tell a health care provider about:  Any possibility of pregnancy or if you are breastfeeding.  Any allergies you have.  All medicines you are taking, including vitamins, herbs, eye drops, creams, and over-the-counter medicines.  Any problems you or family members have had with anesthetic medicines.  Any blood disorders you have.  Any surgeries you have had.  Any medical conditions you have. What happens before the procedure?  Take medicines only as directed by your health care provider.  Your health care provider will let you know when you should start fasting. You may not be able to eat or drink anything after midnight on the night before the procedure or for at least four hours before the test.  Do not wear jewelry to the exam.  Wear loose, comfortable clothing. You may be asked to put on a gown for the procedure. What happens during the procedure?  An IV tube will be inserted into a vein in your hand or arm. It will remain in place throughout the exam.  A small amount of the radiotracer material  will be injected through your IV tube.  You may feel a cold sensation as the material runs through your IV tube.  While you are lying down, a technician will place the gamma camera over your abdomen.  The camera may rotate around your body. You may be asked to stay still or move into a certain position.  You will then be given a medicine called cholecystokinin (CCK) through your IV tube. This will make your gallbladder empty. You may feel nauseous or have cramping for a short time.  Additional images will be taken after CCK is given.  After all the images have been taken, your IV tube will be removed. What happens after the procedure?  You may resume normal activities and diet as directed by your health care provider.  The radiotracer will leave your body over the next few days. There are no long-term side effects from the radiotracer. Drink lots of fluids to help flush it out of your body.  A health care provider who specializes in nuclear medicine will read the scan and send a report to your regular health care provider. This information is not intended to replace advice given to you by your health care provider. Make sure you discuss any questions you have with your health care provider. Document Released: 11/26/2000 Document Revised: 05/06/2016 Document Reviewed: 04/03/2014 Elsevier Interactive Patient Education  [employer].

## 2017-06-16 ENCOUNTER — Ambulatory Visit: Payer: BC Managed Care – PPO

## 2017-06-16 ENCOUNTER — Encounter
Admission: RE | Admit: 2017-06-16 | Discharge: 2017-06-16 | Disposition: A | Discharge status: Home / Self Care | Payer: BC Managed Care – PPO | Source: Ambulatory Visit | Attending: Surgery | Admitting: Surgery

## 2017-06-16 DIAGNOSIS — G8929 Other chronic pain: Secondary | ICD-10-CM | POA: Insufficient documentation

## 2017-06-16 DIAGNOSIS — R1013 Epigastric pain: Secondary | ICD-10-CM | POA: Diagnosis not present

## 2017-06-16 MED ORDER — TECHNETIUM TC 99M MEBROFENIN IV KIT
5.0000 | PACK | Freq: Once | INTRAVENOUS | Status: AC | PRN
  Administered 2017-06-16: 5.25 via INTRAVENOUS

## 2017-06-23 ENCOUNTER — Ambulatory Visit: Payer: BC Managed Care – PPO

## 2017-06-24 ENCOUNTER — Telehealth: Payer: Self-pay

## 2017-06-24 NOTE — Telephone Encounter (Signed)
-----   Message from [place], [place] sent at 06/13/2017  8:43 AM EDT ----- Regarding: RE: HIDA Scan w/ CCK Make sure that her HIDA Scan is done. Then call her with result.   ----- Message ----- From: Wayna Chalet, CMA Sent: 06/09/2017  11:59 AM To: Wayna Chalet, CMA Subject: FW: HIDA Scan w/ CCK                           Once patient has her HIDA Scan and appointment with GI, Dr. Burt Knack will need to see her.  ----- Message ----- From: Wayna Chalet, CMA Sent: 06/16/2017 To:  Subject: HIDA Scan w/ CCK                               Patient's HIDA Scan is scheduled to be done on 06/16/2017. Look for results.

## 2017-06-24 NOTE — Telephone Encounter (Signed)
Called patient to give her the results from her HIDA Scan. I also told her that she needed to go and see Dr. Allen Norris to see what else he could offer her. Then we would call her with a follow up appointment with Dr. Burt Knack if she needed surgical intervention of any sort. Patient understood and had no further questions.  I will send myself a staff message to remind myself to check up on her Gastroenterologist appointment.

## 2017-08-05 ENCOUNTER — Ambulatory Visit (INDEPENDENT_AMBULATORY_CARE_PROVIDER_SITE_OTHER): Payer: BC Managed Care – PPO | Admitting: Physician Assistant

## 2017-08-05 ENCOUNTER — Encounter: Payer: Self-pay | Admitting: Physician Assistant

## 2017-08-05 VITALS — BP 122/80 | HR 91 | Temp 98.3°F | Resp 16 | Wt 227.4 lb

## 2017-08-05 DIAGNOSIS — R1011 Right upper quadrant pain: Secondary | ICD-10-CM | POA: Diagnosis not present

## 2017-08-05 DIAGNOSIS — I10 Essential (primary) hypertension: Secondary | ICD-10-CM

## 2017-08-05 NOTE — Progress Notes (Signed)
Patient: Rachel Wells Female    DOB: [birth year]   46 y.o.   MRN: [id number] Visit Date: 08/05/2017  Today's Provider: Mar Daring, PA-C   Chief Complaint  Patient presents with  . Follow-up    HTN   Subjective:    HPI  Hypertension, follow-up:  BP Readings from Last 3 Encounters:  08/05/17 122/80  06/09/17 120/80  06/07/17 123/77    She was last seen for hypertension 2 months ago.  BP at that visit was 124/78. Management since that visit includes Losartan HCTZ. She reports excellent compliance with treatment. She is not having side effects.  She is exercising. She is adherent to low salt diet.  She has been checking her blood pressures at home and the reading are in the 130's/80's.  She is experiencing none.  Patient denies chest pain, chest pressure/discomfort, exertional chest pressure/discomfort, fatigue, irregular heart beat, lower extremity edema and palpitations.   Cardiovascular risk factors include hypertension.      Weight trend: stable Wt Readings from Last 3 Encounters:  08/05/17 227 lb 6.4 oz (103.1 kg)  06/09/17 224 lb (101.6 kg)  06/07/17 228 lb (103.4 kg)    Current diet: in general, a "healthy" diet    ------------------------------------------------------------------------     Allergies  Allergen Reactions  . Shrimp [Shellfish Allergy] Swelling  . Neomycin-Bacitracin Zn-Polymyx Itching and Rash    REACTION: rash, itch     Current Outpatient Prescriptions:  .  losartan-hydrochlorothiazide (HYZAAR) 50-12.5 MG tablet, Take 1 tablet by mouth daily., Disp: 90 tablet, Rfl: 1 .  metoprolol succinate (TOPROL-XL) 50 MG 24 hr tablet, Take 1 tablet (50 mg total) by mouth daily. Take with or immediately following a meal., Disp: 90 tablet, Rfl: 3 .  MIRENA, 52 MG, 20 MCG/24HR IUD, , Disp: , Rfl:  .  MULTIPLE VITAMIN PO, Take 1 tablet by mouth daily., Disp: , Rfl:  .  omeprazole (PRILOSEC OTC) 20 MG tablet, Take 1 tablet (20 mg  total) by mouth daily., Disp: 28 tablet, Rfl: 1 .  ondansetron (ZOFRAN ODT) 4 MG disintegrating tablet, Allow 1-2 tablets to dissolve in your mouth every 8 hours as needed for nausea/vomiting (Patient not taking: Reported on 08/05/2017), Disp: 30 tablet, Rfl: 0  Review of Systems  Constitutional: Negative.   Respiratory: Negative.   Cardiovascular: Negative for chest pain, palpitations and leg swelling.  Gastrointestinal: Positive for abdominal pain. Negative for abdominal distention, anal bleeding, blood in stool, constipation, diarrhea, nausea and vomiting.  Genitourinary: Negative.   Neurological: Negative.     Social History  Substance Use Topics  . Smoking status: Never Smoker  . Smokeless tobacco: Never Used  . Alcohol use No   Objective:   BP 122/80 (BP Location: Left Arm, Patient Position: Sitting, Cuff Size: Normal)   Pulse 91   Temp 98.3 F (36.8 C) (Oral)   Resp 16   Wt 227 lb 6.4 oz (103.1 kg)   BMI 39.03 kg/m    Physical Exam  Constitutional: She appears well-developed and well-nourished. No distress.  Neck: Normal range of motion. Neck supple. No JVD present. No tracheal deviation present. No thyromegaly present.  Cardiovascular: Normal rate, regular rhythm and normal heart sounds.  Exam reveals no gallop and no friction rub.   No murmur heard. Pulmonary/Chest: Effort normal and breath sounds normal. No respiratory distress. She has no wheezes. She has no rales.  Abdominal: Soft. Bowel sounds are normal. She exhibits no distension and no mass. There  is no tenderness.  Musculoskeletal: She exhibits no edema.  Lymphadenopathy:    She has no cervical adenopathy.  Skin: She is not diaphoretic.  Vitals reviewed.      Assessment & Plan:     1. Essential hypertension Stable and tolerating losartan-hctz well. Continue losartan-hctz 50-12.70m and metoprolol 517m Continue healthy lifestyle modifications including limiting fatty foods and sodium. I will see her back  in 3 months.   2. RUQ pain Patient went to ER on 06/08/17 for RUQ pain and nausea. [place] stones. She then followed up with Dr. CoBurt Knackgen surg, and underwent a HIDA scan that showed her gallbladder is functioning about 60%. She was referred to GI, Dr. WoAllen Norrisand will see him next Tuesday, 08/09/17. She is currently taking omeprazole 2055mbut reports she only just recently started taking this about 2 weeks ago. She has also changed her diet and is avoiding fried foods, fatty foods. She does report since changing her diet she has only had 2 episodes of the RUQ pain. One she can remember came after eating a slice of pizza. I do suspect biliary colic as her source since it is more postprandial now with dietary changes. DDx: biliary colic, duodenal or gastric ulcer, gastritis.       JenMar DaringA-C  [facility]

## 2017-08-09 ENCOUNTER — Other Ambulatory Visit: Payer: Self-pay

## 2017-08-09 ENCOUNTER — Encounter: Payer: Self-pay | Admitting: Gastroenterology

## 2017-08-09 ENCOUNTER — Ambulatory Visit (INDEPENDENT_AMBULATORY_CARE_PROVIDER_SITE_OTHER): Payer: BC Managed Care – PPO | Admitting: Gastroenterology

## 2017-08-09 VITALS — BP 145/82 | HR 76 | Temp 98.0°F | Ht 64.0 in | Wt 225.6 lb

## 2017-08-09 DIAGNOSIS — R1011 Right upper quadrant pain: Secondary | ICD-10-CM

## 2017-08-09 DIAGNOSIS — G8929 Other chronic pain: Secondary | ICD-10-CM | POA: Diagnosis not present

## 2017-08-09 NOTE — Progress Notes (Signed)
Gastroenterology Consultation  Referring Provider:     Florene Glen, MD Primary Care Physician:  Mar Daring, PA-C Primary Gastroenterologist:  Dr. Allen Norris     Reason for Consultation:     Right upper quadrant pain        HPI:   Rachel Wells is a 46 y.o. y/o female referred for consultation & management of Right upper quadrant pain by Dr. Marlyn Corporal, Clearnce Sorrel, PA-C.  This patient comes in today with a history of right upper quadrant pain.  The patient states that she had severe bowel pain and was seen in the emergency room.  The patient was then seen by Dr. Burt Knack who checked her gallbladder and it showed a ejection fraction of 67%.  The patient did not experience any pain after being fed ensure.  She reports that she has had 2 episodes of pain since her ER visit back in June.  One time she was eating potato chips and the other time she was eating pizza.  The patient denies any unexplained weight loss fevers chills nausea or vomiting.  The patient also denies that the pain is present at this time.  She denies any dysphagia nausea vomiting fevers or chills.  She also denies any change in bowel habits such as diarrhea or constipation.  I'm now being asked to see the patient for her right upper quadrant pain.  Past Medical History:  Diagnosis Date  . BP (high blood pressure) 03/22/2008  . Fatigue 04/18/2015  . Fibroid 04/18/2015  . Gastroesophageal reflux disease 12/03/2016  . Headache, migraine 03/22/2008  . Hypertension     Past Surgical History:  Procedure Laterality Date  . DILATION AND CURETTAGE OF UTERUS  1992  . TONSILLECTOMY AND ADENOIDECTOMY      Prior to Admission medications   Medication Sig Start Date End Date Taking? Authorizing Provider  losartan-hydrochlorothiazide (HYZAAR) 50-12.5 MG tablet Take 1 tablet by mouth daily. 06/03/17  Yes Mar Daring, PA-C  metoprolol succinate (TOPROL-XL) 50 MG 24 hr tablet Take 1 tablet (50 mg total) by mouth daily. Take  with or immediately following a meal. 01/03/17  Yes [place]ette, Clearnce Sorrel, PA-C  MIRENA, 52 MG, 20 MCG/24HR IUD  03/25/16  Yes [provider]  MULTIPLE VITAMIN PO Take 1 tablet by mouth daily.   Yes [provider]  omeprazole (PRILOSEC OTC) 20 MG tablet Take 1 tablet (20 mg total) by mouth daily. 06/07/17 06/07/18 Yes Hinda Kehr, MD  Norethindrone Acetate-Ethinyl Estrad-FE (LOESTRIN 24 FE) 1-20 MG-MCG(24) tablet Take by mouth. 08/09/17 08/09/18  [provider]  ondansetron (ZOFRAN ODT) 4 MG disintegrating tablet Allow 1-2 tablets to dissolve in your mouth every 8 hours as needed for nausea/vomiting Patient not taking: Reported on 08/05/2017 06/07/17   Hinda Kehr, MD    Family History  Problem Relation Age of Onset  . Fibromyalgia Mother   . Cancer Mother 82       breast cancer  . Hypertension Mother   . Thyroid disease Mother   . Hypertension Father   . Diabetes Brother   . Hypertension Paternal Grandmother   . Stroke Paternal Grandmother   . Heart disease Paternal Grandfather      Social History  Substance Use Topics  . Smoking status: Never Smoker  . Smokeless tobacco: Never Used  . Alcohol use No    Allergies as of 08/09/2017 - Review Complete 08/09/2017  Allergen Reaction Noted  . Shrimp [shellfish allergy] Swelling 05/20/2015  . Neomycin-bacitracin  zn-polymyx Itching and Rash 01/22/2008    Review of Systems:    All systems reviewed and negative except where noted in HPI.   Physical Exam:  BP (!) 145/82   Pulse 76   Temp 98 F (36.7 C) (Oral)   Ht 5' 4"  (1.626 m)   Wt 225 lb 9.6 oz (102.3 kg)   BMI 38.72 kg/m  No LMP recorded. Patient is not currently having periods (Reason: IUD). Psych:  Alert and cooperative. Normal mood and affect. General:   Alert,  Well-developed, well-nourished, pleasant and cooperative in NAD Head:  Normocephalic and atraumatic. Eyes:  Sclera clear, no icterus.   Conjunctiva pink. Ears:  Normal auditory  acuity. Nose:  No deformity, discharge, or lesions. Mouth:  No deformity or lesions,oropharynx pink & moist. Neck:  Supple; no masses or thyromegaly. Lungs:  Respirations even and unlabored.  Clear throughout to auscultation.   No wheezes, crackles, or rhonchi. No acute distress. Heart:  Regular rate and rhythm; no murmurs, clicks, rubs, or gallops. Abdomen:  Normal bowel sounds.  No bruits.  Soft, non-tender and non-distended without masses, hepatosplenomegaly or hernias noted.  No guarding or rebound tenderness.  Negative Carnett sign.   Rectal:  Deferred.  Msk:  Symmetrical without gross deformities.  Good, equal movement & strength bilaterally. Pulses:  Normal pulses noted. Extremities:  No clubbing or edema.  No cyanosis. Neurologic:  Alert and oriented x3;  grossly normal neurologically. Skin:  Intact without significant lesions or rashes.  No jaundice. Lymph Nodes:  No significant cervical adenopathy. Psych:  Alert and cooperative. Normal mood and affect.  Imaging Studies: No results found.  Assessment and Plan:   Rachel Wells is a 46 y.o. y/o female who comes in with abdominal pain that started in June and she has had 2 episodes of it since.  The patient's gallbladder workup was negative. The patient reports the pain occurred when she ate potato chips and pizza but has only happened twice since June.  The pain is not reproducible at this time nor is she having any symptoms.  The patient has been told to contact me if the symptoms return.  It is unlikely caused by reflux or peptic ulcer disease since the pain only occurs for a few hours then goes away and not related to any left-sided abdominal pain.  The patient has been explained the plan and agrees with it.  Lucilla Lame, MD. Marval Regal   Note: This dictation was prepared with Dragon dictation along with smaller phrase technology. Any transcriptional errors that result from this process are unintentional.

## 2017-08-17 ENCOUNTER — Telehealth: Payer: Self-pay

## 2017-08-17 NOTE — Telephone Encounter (Signed)
-----   Message from [place], [place] sent at 08/16/2017  3:51 PM EDT ----- Regarding: RE: HIDA Scan w/ CCK Call patient to let her know that I saw Dr. Dorothey Baseman office note and he recommended for her to call him in case her simptoms came back. However, no need to see him or Dr. Burt Knack back if she had been okay. I will let her know that if her pain comes back, to give [place] a call.   ----- Message ----- From: Wayna Chalet, CMA Sent: 08/10/2017 To: Wayna Chalet, CMA Subject: RE: HIDA Scan w/ CCK                           Patient's appt with GI will be until 08/09/2017. Then she will need to see Dr. Burt Knack after the HIDA and GI appt.   ----- Message ----- From: Wayna Chalet, CMA Sent: 06/09/2017  11:59 AM To: Wayna Chalet, CMA Subject: FW: HIDA Scan w/ CCK                           Once patient has her HIDA Scan and appointment with GI, Dr. Burt Knack will need to see her.  ----- Message ----- From: Wayna Chalet, CMA Sent: 06/16/2017 To:  Subject: HIDA Scan w/ CCK                               Patient's HIDA Scan is scheduled to be done on 06/16/2017. Look for results.

## 2017-08-17 NOTE — Telephone Encounter (Signed)
Called patient and left her a detailed message letting her know that at this time she does not need surgery. But if her symptoms came back, to contact Dr. Allen Norris and then if she needed surgery, he would let us know.

## 2017-09-06 ENCOUNTER — Other Ambulatory Visit: Payer: Self-pay | Admitting: Obstetrics & Gynecology

## 2017-09-06 DIAGNOSIS — Z1231 Encounter for screening mammogram for malignant neoplasm of breast: Secondary | ICD-10-CM

## 2017-10-17 ENCOUNTER — Ambulatory Visit
Admission: RE | Admit: 2017-10-17 | Discharge: 2017-10-17 | Disposition: A | Discharge status: Home / Self Care | Payer: BC Managed Care – PPO | Source: Ambulatory Visit | Attending: Obstetrics & Gynecology | Admitting: Obstetrics & Gynecology

## 2017-10-17 DIAGNOSIS — Z1231 Encounter for screening mammogram for malignant neoplasm of breast: Secondary | ICD-10-CM | POA: Diagnosis present

## 2017-10-20 ENCOUNTER — Other Ambulatory Visit: Payer: Self-pay | Admitting: *Deleted

## 2017-10-20 ENCOUNTER — Inpatient Hospital Stay
Admission: RE | Admit: 2017-10-20 | Discharge: 2017-10-20 | Disposition: A | Discharge status: Home / Self Care | Payer: Self-pay | Source: Ambulatory Visit | Attending: *Deleted | Admitting: *Deleted

## 2017-10-20 DIAGNOSIS — Z9289 Personal history of other medical treatment: Secondary | ICD-10-CM

## 2017-10-24 ENCOUNTER — Other Ambulatory Visit: Payer: Self-pay | Admitting: Obstetrics & Gynecology

## 2017-10-24 DIAGNOSIS — N6489 Other specified disorders of breast: Secondary | ICD-10-CM

## 2017-10-24 DIAGNOSIS — R928 Other abnormal and inconclusive findings on diagnostic imaging of breast: Secondary | ICD-10-CM

## 2017-10-27 ENCOUNTER — Ambulatory Visit
Admission: RE | Admit: 2017-10-27 | Discharge: 2017-10-27 | Disposition: A | Discharge status: Home / Self Care | Payer: BC Managed Care – PPO | Source: Ambulatory Visit | Attending: Obstetrics & Gynecology | Admitting: Obstetrics & Gynecology

## 2017-10-27 DIAGNOSIS — N6489 Other specified disorders of breast: Secondary | ICD-10-CM

## 2017-10-27 DIAGNOSIS — R928 Other abnormal and inconclusive findings on diagnostic imaging of breast: Secondary | ICD-10-CM

## 2017-11-02 ENCOUNTER — Encounter: Payer: Self-pay | Admitting: Physician Assistant

## 2017-11-02 ENCOUNTER — Ambulatory Visit: Payer: BC Managed Care – PPO | Admitting: Physician Assistant

## 2017-11-02 ENCOUNTER — Other Ambulatory Visit: Payer: Self-pay

## 2017-11-02 VITALS — BP 138/80 | HR 87 | Temp 97.9°F | Resp 16 | Wt 227.2 lb

## 2017-11-02 DIAGNOSIS — J4520 Mild intermittent asthma, uncomplicated: Secondary | ICD-10-CM

## 2017-11-02 DIAGNOSIS — Z6839 Body mass index (BMI) 39.0-39.9, adult: Secondary | ICD-10-CM

## 2017-11-02 DIAGNOSIS — I10 Essential (primary) hypertension: Secondary | ICD-10-CM | POA: Diagnosis not present

## 2017-11-02 DIAGNOSIS — Z713 Dietary counseling and surveillance: Secondary | ICD-10-CM | POA: Diagnosis not present

## 2017-11-02 MED ORDER — ALBUTEROL SULFATE HFA 108 (90 BASE) MCG/ACT IN AERS: 1.0000 | INHALATION_SPRAY | Freq: Four times a day (QID) | RESPIRATORY_TRACT | 0 refills | Status: DC | PRN

## 2017-11-02 MED ORDER — LORCASERIN HCL 10 MG PO TABS: 1.0000 | ORAL_TABLET | Freq: Two times a day (BID) | ORAL | 0 refills | Status: DC

## 2017-11-02 NOTE — Patient Instructions (Addendum)
Glucosamine and Chondroitin Tumeric   Belviq-Lorcaserin oral tablets What is this medicine? LORCASERIN (lor ca SER in) is used to promote and maintain weight loss in obese patients. This medicine should be used with a reduced calorie diet and, if appropriate, an exercise program. This medicine may be used for other purposes; ask your health care provider or pharmacist if you have questions. COMMON BRAND NAME(S): Belviq What should I tell my health care provider before I take this medicine? They need to know if you have any of these conditions: -anatomical deformation of the penis, Peyronie's disease, or history of priapism (painful and prolonged erection) -diabetes -heart disease -history of blood diseases, like sickle cell anemia or leukemia -history of irregular heartbeat -kidney disease -liver disease -suicidal thoughts, plans, or attempt; a previous suicide attempt by you or a family member -an unusual or allergic reaction to lorcaserin, other medicines, foods, dyes, or preservatives -pregnant or trying to get pregnant -breast-feeding How should I use this medicine? Take this medicine by mouth with a glass of water. Follow the directions on the prescription label. You can take it with or without food. Take your medicine at regular intervals. Do not take it more often than directed. Do not stop taking except on your doctor's advice. Talk to your pediatrician regarding the use of this medicine in children. Special care may be needed. Overdosage: If you think you have taken too much of this medicine contact a poison control center or emergency room at once. NOTE: This medicine is only for you. Do not share this medicine with others. What if I miss a dose? If you miss a dose, take it as soon as you can. If it is almost time for your next dose, take only that dose. Do not take double or extra doses. What may interact with this medicine? -cabergoline -certain medicines for depression,  anxiety, or psychotic disturbances -certain medicines for erectile dysfunction -certain medicines for migraine headache like almotriptan, eletriptan, frovatriptan, naratriptan, rizatriptan, sumatriptan, zolmitriptan -dextromethorphan -linezolid -lithium -medicines for diabetes -other weight loss products -tramadol -St. John's Wort -stimulant medicines for attention disorders, weight loss, or to stay awake -tryptophan This list may not describe all possible interactions. Give your health care provider a list of all the medicines, herbs, non-prescription drugs, or dietary supplements you use. Also tell them if you smoke, drink alcohol, or use illegal drugs. Some items may interact with your medicine. What should I watch for while using this medicine? This medicine is intended to be used in addition to a healthy diet and appropriate exercise. The best results are achieved this way. Your doctor should instruct you to stop taking this medicine if you do not lose a certain amount of weight within the first 12 weeks of treatment, but it is important that you do not change your dose in any way without consulting your doctor or health care professional. Visit your doctor or health care professional for regular checkups. Your doctor may order blood tests or other tests to see how you are doing. Do not drive, use machinery, or do anything that needs mental alertness until you know how this medicine affects you. This medicine may affect blood sugar levels. If you have diabetes, check with your doctor or health care professional before you change your diet or the dose of your diabetic medicine. Patients and their families should watch out for worsening depression or thoughts of suicide. Also watch out for sudden changes in feelings such as feeling anxious, agitated, panicky, irritable,  hostile, aggressive, impulsive, severely restless, overly excited and hyperactive, or not being able to sleep. If this happens,  especially at the beginning of treatment or after a change in dose, call your health care professional. Contact your doctor or health care professional right away if you are a man with an erection that lasts longer than 4 hours or if the erection becomes painful. This may be a sign of serious problem and must be treated right away to prevent permanent damage. What side effects may I notice from receiving this medicine? Side effects that you should report to your doctor or health care professional as soon as possible: -allergic reactions like skin rash, itching or hives, swelling of the face, lips, or tongue -abnormal production of milk -breast enlargement in both males and females -breathing problems -changes in emotions or moods -changes in vision -confusion -erection lasting more than 4 hours or a painful erection -fast or irregular heart beat -feeling faint or lightheaded, falls -fever or chills, sore throat -hallucination, loss of contact with reality -high or low blood pressure -menstrual changes -restlessness -slow or irregular heartbeat -stiff muscles -sweating -suicidal thoughts or other mood changes -swelling of the ankles, feet, hands -unusually weak or tired -vomiting Side effects that usually do not require medical attention (report to your doctor or health care professional if they continue or are bothersome): -back pain -constipation -cough -dry mouth -nausea -tiredness This list may not describe all possible side effects. Call your doctor for medical advice about side effects. You may report side effects to FDA at 1-800-FDA-1088. Where should I keep my medicine? Keep out of the reach of children. This medicine can be abused. Keep your medicine in a safe place to protect it from theft. Do not share this medicine with anyone. Selling or giving away this medicine is dangerous and against the law. Store at room temperature between 15 and 30 degrees C (59 and 86 degrees  F). Throw away any unused medicine after the expiration date. NOTE: This sheet is a summary. It may not cover all possible information. If you have questions about this medicine, talk to your doctor, pharmacist, or health care provider.  2018 Elsevier/Gold Standard (2016-01-01 12:13:31) Albuterol inhalation aerosol What is this medicine? ALBUTEROL (al [place]) is a bronchodilator. It helps open up the airways in your lungs to make it easier to breathe. This medicine is used to treat and to prevent bronchospasm. This medicine may be used for other purposes; ask your health care provider or pharmacist if you have questions. COMMON BRAND NAME(S): Proair HFA, Proventil, Proventil HFA, Respirol, Ventolin, Ventolin HFA What should I tell my health care provider before I take this medicine? They need to know if you have any of the following conditions: -diabetes -heart disease or irregular heartbeat -high blood pressure -pheochromocytoma -seizures -thyroid disease -an unusual or allergic reaction to albuterol, levalbuterol, sulfites, other medicines, foods, dyes, or preservatives -pregnant or trying to get pregnant -breast-feeding How should I use this medicine? This medicine is for inhalation through the mouth. Follow the directions on your prescription label. Take your medicine at regular intervals. Do not use more often than directed. Make sure that you are using your inhaler correctly. Ask you doctor or health care provider if you have any questions. Talk to your pediatrician regarding the use of this medicine in children. Special care may be needed. Overdosage: If you think you have taken too much of this medicine contact a poison control center or emergency room  at once. NOTE: This medicine is only for you. Do not share this medicine with others. What if I miss a dose? If you miss a dose, use it as soon as you can. If it is almost time for your next dose, use only that dose. Do not use  double or extra doses. What may interact with this medicine? -anti-infectives like chloroquine and pentamidine -caffeine -cisapride -diuretics -medicines for colds -medicines for depression or for emotional or psychotic conditions -medicines for weight loss including some herbal products -methadone -some antibiotics like clarithromycin, erythromycin, levofloxacin, and linezolid -some heart medicines -steroid hormones like dexamethasone, cortisone, hydrocortisone -theophylline -thyroid hormones This list may not describe all possible interactions. Give your health care provider a list of all the medicines, herbs, non-prescription drugs, or dietary supplements you use. Also tell them if you smoke, drink alcohol, or use illegal drugs. Some items may interact with your medicine. What should I watch for while using this medicine? Tell your doctor or health care professional if your symptoms do not improve. Do not use extra albuterol. If your asthma or bronchitis gets worse while you are using this medicine, call your doctor right away. If your mouth gets dry try chewing sugarless gum or sucking hard candy. Drink water as directed. What side effects may I notice from receiving this medicine? Side effects that you should report to your doctor or health care professional as soon as possible: -allergic reactions like skin rash, itching or hives, swelling of the face, lips, or tongue -breathing problems -chest pain -feeling faint or lightheaded, falls -high blood pressure -irregular heartbeat -fever -muscle cramps or weakness -pain, tingling, numbness in the hands or feet -vomiting Side effects that usually do not require medical attention (report to your doctor or health care professional if they continue or are bothersome): -cough -difficulty sleeping -headache -nervousness or trembling -stomach upset -stuffy or runny nose -throat irritation -unusual taste This list may not describe  all possible side effects. Call your doctor for medical advice about side effects. You may report side effects to FDA at 1-800-FDA-1088. Where should I keep my medicine? Keep out of the reach of children. Store at room temperature between 15 and 30 degrees C (59 and 86 degrees F). The contents are under pressure and may burst when exposed to heat or flame. Do not freeze. This medicine does not work as well if it is too cold. Throw away any unused medicine after the expiration date. Inhalers need to be thrown away after the labeled number of puffs have been used or by the expiration date; whichever comes first. Ventolin HFA should be thrown away 12 months after removing from foil pouch. Check the instructions that come with your medicine. NOTE: This sheet is a summary. It may not cover all possible information. If you have questions about this medicine, talk to your doctor, pharmacist, or health care provider.  2018 Elsevier/Gold Standard (2013-05-17 10:57:17)

## 2017-11-02 NOTE — Progress Notes (Signed)
Patient: Rachel Wells Female    DOB: 12/17/1970   46 y.o.   MRN: [id number] Visit Date: 11/02/2017  Today's Provider: Mar Daring, PA-C   Chief Complaint  Patient presents with  . Follow-up    HTN   Subjective:    HPI  Hypertension, follow-up:  BP Readings from Last 3 Encounters:  11/02/17 138/80  08/09/17 (!) 145/82  08/05/17 122/80    She was last seen for hypertension 3 months ago.  BP at that visit was 122/80. Management since that visit includes none. She reports excellent compliance with treatment. She is not having side effects.  She is not exercising. She is adherent to low salt diet.   Outside blood pressures are 120's/80's She is experiencing none.  Patient denies chest pain, chest pressure/discomfort, exertional chest pressure/discomfort, fatigue, irregular heart beat, lower extremity edema, near-syncope and palpitations.   Cardiovascular risk factors include hypertension and obesity (BMI >= 30 kg/m2).  Use of agents associated with hypertension: none.     Weight trend: stable Wt Readings from Last 3 Encounters:  11/02/17 227 lb 3.2 oz (103.1 kg)  08/09/17 225 lb 9.6 oz (102.3 kg)  08/05/17 227 lb 6.4 oz (103.1 kg)    Current diet: in general, a "healthy" diet    ------------------------------------------------------------------------ She reports that out of no where she feels like she is not able to breath. She has had three episodes in the last 3 weeks. She does report a family history of asthma in her brother and sister. She also reports it only really happens when exposed to cold air.   Patient Decline Influenza Vaccine.    Allergies  Allergen Reactions  . Shrimp [Shellfish Allergy] Swelling  . Neomycin-Bacitracin Zn-Polymyx Itching and Rash    REACTION: rash, itch     Current Outpatient Medications:  .  cetirizine (ZYRTEC) 10 MG tablet, Take by mouth., Disp: , Rfl:  .  dextromethorphan-guaiFENesin (MUCINEX DM) 30-600 MG  12hr tablet, Take 1 tablet by mouth 2 (two) times daily., Disp: , Rfl:  .  losartan-hydrochlorothiazide (HYZAAR) 50-12.5 MG tablet, Take 1 tablet by mouth daily., Disp: 90 tablet, Rfl: 1 .  metoprolol succinate (TOPROL-XL) 50 MG 24 hr tablet, Take 1 tablet (50 mg total) by mouth daily. Take with or immediately following a meal., Disp: 90 tablet, Rfl: 3 .  MIRENA, 52 MG, 20 MCG/24HR IUD, , Disp: , Rfl:  .  MULTIPLE VITAMIN PO, Take 1 tablet by mouth daily., Disp: , Rfl:  .  omeprazole (PRILOSEC OTC) 20 MG tablet, Take 1 tablet (20 mg total) by mouth daily., Disp: 28 tablet, Rfl: 1 .  Norethindrone Acetate-Ethinyl Estrad-FE (LOESTRIN 24 FE) 1-20 MG-MCG(24) tablet, Take by mouth., Disp: , Rfl:  .  ondansetron (ZOFRAN ODT) 4 MG disintegrating tablet, Allow 1-2 tablets to dissolve in your mouth every 8 hours as needed for nausea/vomiting (Patient not taking: Reported on 08/05/2017), Disp: 30 tablet, Rfl: 0  Review of Systems  Constitutional: Negative.   Eyes: Negative for visual disturbance.  Respiratory: Positive for chest tightness and shortness of breath. Negative for cough and wheezing.   Cardiovascular: Negative for chest pain, palpitations and leg swelling.  Gastrointestinal: Negative.   Neurological: Negative for dizziness and headaches.    Social History   Tobacco Use  . Smoking status: Never Smoker  . Smokeless tobacco: Never Used  Substance Use Topics  . Alcohol use: No    Alcohol/week: 0.0 oz   Objective:   BP 138/80 (  BP Location: Left Arm, Patient Position: Sitting, Cuff Size: Normal)   Pulse 87   Temp 97.9 F (36.6 C) (Oral)   Resp 16   Wt 227 lb 3.2 oz (103.1 kg)   SpO2 97%   BMI 39.00 kg/m    Physical Exam  Constitutional: She appears well-developed and well-nourished. No distress.  Neck: Normal range of motion. Neck supple. No JVD present. No tracheal deviation present. No thyromegaly present.  Cardiovascular: Normal rate, regular rhythm and normal heart sounds.  Exam reveals no gallop and no friction rub.  No murmur heard. Pulmonary/Chest: Effort normal and breath sounds normal. No respiratory distress. She has no wheezes. She has no rales.  Musculoskeletal: She exhibits no edema.  Lymphadenopathy:    She has no cervical adenopathy.  Skin: She is not diaphoretic.  Vitals reviewed.      Assessment & Plan:     1. Essential hypertension Stable currently. Continue losartan-hctz 50-12.36m and metoprolol 559m   2. 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 Will try albuterol as below to see if symptoms improve. If not, she is to call the office and will refer to ENT to see if it is upper airway as she has chronic sinusitis issues.  - albuterol (PROVENTIL HFA;VENTOLIN HFA) 108 (90 Base) MCG/ACT inhaler; Inhale 1-2 puffs into the lungs every 6 (six) hours as needed for wheezing or shortness of breath.  Dispense: 1 Inhaler; Refill: 0  3. Encounter for weight loss counseling Has tried phentermine in the past that she tolerated and worked well but increased her BP and had to be stopped. Will try Belviq as below. I will see her back in 4 weeks to recheck BP on Belviq and see how she is tolerating the medication. - Lorcaserin HCl 10 MG TABS; Take 1 tablet by mouth 2 (two) times daily.  Dispense: 60 tablet; Refill: 0  4. Class 2 severe obesity due to excess calories with serious comorbidity and body mass index (BMI) of 39.0 to 39.9 in adult ([facility]. - Lorcaserin HCl 10 MG TABS; Take 1 tablet by mouth 2 (two) times daily.  Dispense: 60 tablet; Refill: 0       JeMar DaringPA-C  [facility]

## 2017-11-25 ENCOUNTER — Ambulatory Visit: Payer: Self-pay | Admitting: Physician Assistant

## 2017-12-02 ENCOUNTER — Ambulatory Visit: Payer: Self-pay | Admitting: Physician Assistant

## 2017-12-04 ENCOUNTER — Other Ambulatory Visit: Payer: Self-pay | Admitting: Physician Assistant

## 2017-12-04 DIAGNOSIS — I10 Essential (primary) hypertension: Secondary | ICD-10-CM

## 2018-01-02 ENCOUNTER — Other Ambulatory Visit: Payer: Self-pay | Admitting: Physician Assistant

## 2018-01-02 DIAGNOSIS — I10 Essential (primary) hypertension: Secondary | ICD-10-CM

## 2018-01-11 ENCOUNTER — Ambulatory Visit: Payer: Self-pay | Admitting: Physician Assistant

## 2018-01-24 ENCOUNTER — Other Ambulatory Visit: Payer: Self-pay | Admitting: Physician Assistant

## 2018-01-24 DIAGNOSIS — K219 Gastro-esophageal reflux disease without esophagitis: Secondary | ICD-10-CM

## 2018-01-25 ENCOUNTER — Encounter: Payer: Self-pay | Admitting: Physician Assistant

## 2018-04-17 ENCOUNTER — Encounter: Payer: Self-pay | Admitting: Physician Assistant

## 2018-04-17 ENCOUNTER — Other Ambulatory Visit: Payer: Self-pay

## 2018-04-17 ENCOUNTER — Ambulatory Visit (INDEPENDENT_AMBULATORY_CARE_PROVIDER_SITE_OTHER): Payer: BC Managed Care – PPO | Admitting: Physician Assistant

## 2018-04-17 VITALS — BP 138/88 | HR 81 | Temp 98.5°F | Wt 218.0 lb

## 2018-04-17 DIAGNOSIS — J0100 Acute maxillary sinusitis, unspecified: Secondary | ICD-10-CM | POA: Diagnosis not present

## 2018-04-17 DIAGNOSIS — R059 Cough, unspecified: Secondary | ICD-10-CM

## 2018-04-17 DIAGNOSIS — R05 Cough: Secondary | ICD-10-CM | POA: Diagnosis not present

## 2018-04-17 MED ORDER — AMOXICILLIN-POT CLAVULANATE 875-125 MG PO TABS: 1.0000 | ORAL_TABLET | Freq: Two times a day (BID) | ORAL | 0 refills | Status: DC

## 2018-04-17 MED ORDER — BENZONATATE 200 MG PO CAPS: 200.0000 mg | ORAL_CAPSULE | Freq: Three times a day (TID) | ORAL | 0 refills | Status: DC | PRN

## 2018-04-17 NOTE — Patient Instructions (Signed)
Sinusitis, Adult Sinusitis is soreness and inflammation of your sinuses. Sinuses are hollow spaces in the bones around your face. They are located:  Around your eyes.  In the middle of your forehead.  Behind your nose.  In your cheekbones.  Your sinuses and nasal passages are lined with a stringy fluid (mucus). Mucus normally drains out of your sinuses. When your nasal tissues get inflamed or swollen, the mucus can get trapped or blocked so air cannot flow through your sinuses. This lets bacteria, viruses, and funguses grow, and that leads to infection. Follow these instructions at home: Medicines  Take, use, or apply over-the-counter and prescription medicines only as told by your doctor. These may include nasal sprays.  If you were prescribed an antibiotic medicine, take it as told by your doctor. Do not stop taking the antibiotic even if you start to feel better. Hydrate and Humidify  Drink enough water to keep your pee (urine) clear or pale yellow.  Use a cool mist humidifier to keep the humidity level in your home above 50%.  Breathe in steam for 10-15 minutes, 3-4 times a day or as told by your doctor. You can do this in the bathroom while a hot shower is running.  Try not to spend time in cool or dry air. Rest  Rest as much as possible.  Sleep with your head raised (elevated).  Make sure to get enough sleep each night. General instructions  Put a warm, moist washcloth on your face 3-4 times a day or as told by your doctor. This will help with discomfort.  Wash your hands often with soap and water. If there is no soap and water, use hand sanitizer.  Do not smoke. Avoid being around people who are smoking (secondhand smoke).  Keep all follow-up visits as told by your doctor. This is important. Contact a doctor if:  You have a fever.  Your symptoms get worse.  Your symptoms do not get better within 10 days. Get help right away if:  You have a very bad  headache.  You cannot stop throwing up (vomiting).  You have pain or swelling around your face or eyes.  You have trouble seeing.  You feel confused.  Your neck is stiff.  You have trouble breathing. This information is not intended to replace advice given to you by your health care provider. Make sure you discuss any questions you have with your health care provider. Document Released: 05/17/2008 Document Revised: 07/25/2016 Document Reviewed: 09/24/2015 Elsevier Interactive Patient Education  [employer].

## 2018-04-17 NOTE — Progress Notes (Signed)
Patient: Rachel Wells Female    DOB: [birth year]   47 y.o.   MRN: [id number] Visit Date: 04/17/2018  Today's Provider: Mar Daring, PA-C   Chief Complaint  Patient presents with  . Cough   Subjective:    Cough  This is a new problem. The current episode started in the past 7 days. The problem has been gradually worsening. The cough is productive of sputum. Associated symptoms include chills, a fever, nasal congestion, postnasal drip and a sore throat. Pertinent negatives include no ear pain. The symptoms are aggravated by lying down. She has tried OTC cough suppressant and rest for the symptoms. The treatment provided mild relief. Her past medical history is significant for asthma and environmental allergies.      Allergies  Allergen Reactions  . Shrimp [Shellfish Allergy] Swelling  . Neomycin-Bacitracin Zn-Polymyx Itching and Rash    REACTION: rash, itch     Current Outpatient Medications:  .  cetirizine (ZYRTEC) 10 MG tablet, Take by mouth., Disp: , Rfl:  .  dextromethorphan-guaiFENesin (MUCINEX DM) 30-600 MG 12hr tablet, Take 1 tablet by mouth 2 (two) times daily., Disp: , Rfl:  .  Lorcaserin HCl 10 MG TABS, Take 1 tablet by mouth 2 (two) times daily., Disp: 60 tablet, Rfl: 0 .  losartan-hydrochlorothiazide (HYZAAR) 50-12.5 MG tablet, TAKE 1 TABLET BY MOUTH ONCE DAILY, Disp: 90 tablet, Rfl: 1 .  metoprolol succinate (TOPROL-XL) 50 MG 24 hr tablet, TAKE ONE TABLET BY MOUTH ONCE DAILY. TAKE WITH OR IMMEDIATELY FOLLOWING A MEAL., Disp: 90 tablet, Rfl: 3 .  MIRENA, 52 MG, 20 MCG/24HR IUD, , Disp: , Rfl:  .  MULTIPLE VITAMIN PO, Take 1 tablet by mouth daily., Disp: , Rfl:  .  omeprazole (PRILOSEC) 20 MG capsule, TAKE ONE CAPSULE BY MOUTH ONCE DAILY, Disp: 90 capsule, Rfl: 1 .  ondansetron (ZOFRAN ODT) 4 MG disintegrating tablet, Allow 1-2 tablets to dissolve in your mouth every 8 hours as needed for nausea/vomiting (Patient not taking: Reported on 08/05/2017), Disp: 30  tablet, Rfl: 0  Review of Systems  Constitutional: Positive for chills and fever.  HENT: Positive for congestion, postnasal drip, sinus pain and sore throat. Negative for ear pain.   Respiratory: Positive for cough.   Cardiovascular: Negative.   Gastrointestinal: Negative.   Genitourinary: Negative.   Musculoskeletal: Negative.   Allergic/Immunologic: Positive for environmental allergies.    Social History   Tobacco Use  . Smoking status: Never Smoker  . Smokeless tobacco: Never Used  Substance Use Topics  . Alcohol use: No    Alcohol/week: 0.0 oz   Objective:   BP 138/88 (BP Location: Left Arm, Patient Position: Sitting, Cuff Size: Large)   Pulse 81   Temp 98.5 F (36.9 C) (Oral)   Wt 218 lb (98.9 kg)   SpO2 99%   BMI 37.42 kg/m  Vitals:   04/17/18 0936  BP: 138/88  Pulse: 81  Temp: 98.5 F (36.9 C)  TempSrc: Oral  SpO2: 99%  Weight: 218 lb (98.9 kg)     Physical Exam  Constitutional: She appears well-developed and well-nourished. No distress.  HENT:  Head: Normocephalic and atraumatic.  Right Ear: Hearing, tympanic membrane, external ear and ear canal normal.  Left Ear: Hearing, tympanic membrane, external ear and ear canal normal.  Nose: Mucosal edema and rhinorrhea present. Right sinus exhibits maxillary sinus tenderness. Right sinus exhibits no frontal sinus tenderness. Left sinus exhibits maxillary sinus tenderness. Left sinus exhibits no frontal sinus  tenderness.  Mouth/Throat: Uvula is midline and mucous membranes are normal. Posterior oropharyngeal erythema present. No oropharyngeal exudate or posterior oropharyngeal edema.  Neck: Normal range of motion. Neck supple. No tracheal deviation present. No thyromegaly present.  Cardiovascular: Normal rate, regular rhythm and normal heart sounds. Exam reveals no gallop and no friction rub.  No murmur heard. Pulmonary/Chest: Effort normal and breath sounds normal. No stridor. No respiratory distress. She has no  wheezes. She has no rales.  Lymphadenopathy:    She has no cervical adenopathy.  Skin: She is not diaphoretic.  Vitals reviewed.       Assessment & Plan:     1. Acute maxillary sinusitis, recurrence not specified Worsening symptoms that have not responded to OTC medications. Will give augmentin as below. Continue allergy medications. Stay well hydrated and get plenty of rest. Call if no symptom improvement or if symptoms worsen. - amoxicillin-clavulanate (AUGMENTIN) 875-125 MG tablet; Take 1 tablet by mouth 2 (two) times daily.  Dispense: 20 tablet; Refill: 0  2. Cough  - benzonatate (TESSALON) 200 MG capsule; Take 1 capsule (200 mg total) by mouth 3 (three) times daily as needed for cough.  Dispense: 30 capsule; Refill: 0       Mar Daring, PA-C  [facility]

## 2018-04-21 ENCOUNTER — Other Ambulatory Visit: Payer: Self-pay | Admitting: Obstetrics & Gynecology

## 2018-04-21 ENCOUNTER — Encounter: Payer: Self-pay | Admitting: Physician Assistant

## 2018-04-21 DIAGNOSIS — J40 Bronchitis, not specified as acute or chronic: Secondary | ICD-10-CM

## 2018-04-21 DIAGNOSIS — N6002 Solitary cyst of left breast: Secondary | ICD-10-CM

## 2018-04-21 MED ORDER — PREDNISONE 10 MG (21) PO TBPK: ORAL_TABLET | ORAL | 0 refills | Status: DC

## 2018-04-21 NOTE — Addendum Note (Signed)
Addended by: Mar Daring on: 04/21/2018 11:45 AM   Modules accepted: Orders

## 2018-05-17 ENCOUNTER — Ambulatory Visit
Admission: RE | Admit: 2018-05-17 | Discharge: 2018-05-17 | Disposition: A | Discharge status: Home / Self Care | Payer: BC Managed Care – PPO | Source: Ambulatory Visit | Attending: Obstetrics & Gynecology | Admitting: Obstetrics & Gynecology

## 2018-05-17 DIAGNOSIS — N6002 Solitary cyst of left breast: Secondary | ICD-10-CM | POA: Diagnosis not present

## 2018-06-04 ENCOUNTER — Other Ambulatory Visit: Payer: Self-pay | Admitting: Physician Assistant

## 2018-06-04 DIAGNOSIS — I10 Essential (primary) hypertension: Secondary | ICD-10-CM

## 2018-08-08 IMAGING — NM NM HEPATO W/GB/PHARM/[PERSON_NAME]
2 series · 12 of 12 positions shown · non-contrast
Comparison: Abdominal ultrasound 06/07/2017

CLINICAL DATA: Nausea and vomiting for 1 week

EXAM:
NUCLEAR MEDICINE HEPATOBILIARY IMAGING WITH GALLBLADDER EF
TECHNIQUE: Sequential images of the abdomen were obtained [DATE] minutes
following intravenous administration of radiopharmaceutical. After
oral ingestion of Ensure, gallbladder ejection fraction was
determined. At 60 min, normal ejection fraction is greater than 33%.
RADIOPHARMACEUTICALS:  5.25 mCi Xc-LLm  Choletec IV

[Series 1000: hepatobiliary scan · 9.59mm/px · 6 of 60 frames shown]
[frame 6/60]
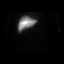
[frame 16/60]
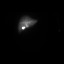
[frame 26/60]
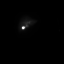
[frame 36/60]
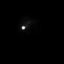
[frame 46/60]
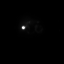
[frame 56/60]
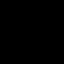

[Series 1000: gallbladder ef · 4.80mm/px · 6 of 120 frames shown]
[frame 11/120]
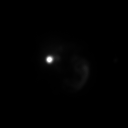
[frame 31/120]
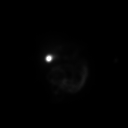
[frame 51/120]
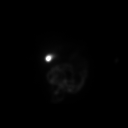
[frame 71/120]
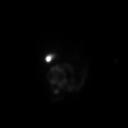
[frame 91/120]
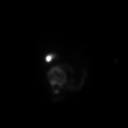
[frame 111/120]
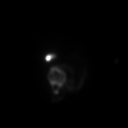

[12 of 12 positions shown; findings below may reference images not displayed]

FINDINGS: Satisfactory uptake of radiopharmaceutical from the blood pool.
Biliary and gallbladder activity by 10 minutes. Bowel activity at 34
minutes.

No symptoms with the Ensure meal.

Calculated gallbladder ejection fraction is 67%. (Normal gallbladder
ejection fraction with Ensure is greater than 33%.)
IMPRESSION: 1. Normal hepatobiliary scan, gallbladder ejection fraction is 67%.

## 2018-08-14 ENCOUNTER — Other Ambulatory Visit: Payer: Self-pay | Admitting: Physician Assistant

## 2018-08-14 DIAGNOSIS — K219 Gastro-esophageal reflux disease without esophagitis: Secondary | ICD-10-CM

## 2018-08-28 ENCOUNTER — Encounter: Payer: Self-pay | Admitting: Physician Assistant

## 2018-09-05 ENCOUNTER — Telehealth: Payer: Self-pay | Admitting: Physician Assistant

## 2018-09-05 ENCOUNTER — Other Ambulatory Visit: Payer: Self-pay | Admitting: Physician Assistant

## 2018-09-05 DIAGNOSIS — I10 Essential (primary) hypertension: Secondary | ICD-10-CM

## 2018-09-05 MED ORDER — LOSARTAN POTASSIUM-HCTZ 100-25 MG PO TABS: 0.5000 | ORAL_TABLET | Freq: Every day | ORAL | 3 refills | Status: DC

## 2018-09-05 MED ORDER — LOSARTAN POTASSIUM-HCTZ 50-12.5 MG PO TABS: 1.0000 | ORAL_TABLET | Freq: Every day | ORAL | 1 refills | Status: DC

## 2018-09-05 NOTE — Telephone Encounter (Signed)
[facility] faxed refill request for the following medications:  losartan-hydrochlorothiazide (HYZAAR) 50-12.5 MG tablet  Qty: 90  Last Rx: 06/05/2018  Last fill date: 09/01/2018  Please advise.

## 2018-09-05 NOTE — Telephone Encounter (Signed)
[facility] called saying they do not have that strength  They can do a different strength combination.  She will refaxing a new rx.  Thanks [employer]

## 2018-09-05 NOTE — Telephone Encounter (Signed)
Rachel Wells sent a fax stating that Rachel Wells 50-12.5 is unavailable.  They state that they need a new Rx that either separates the two medications or an Rx for one-half of a 100/73m tab.

## 2018-09-05 NOTE — Telephone Encounter (Signed)
Sent in losartan-hctz 100-25 to take half tab daily

## 2018-10-24 ENCOUNTER — Encounter: Payer: Self-pay | Admitting: Physician Assistant

## 2018-10-24 ENCOUNTER — Ambulatory Visit: Payer: BC Managed Care – PPO | Admitting: Physician Assistant

## 2018-10-24 VITALS — BP 136/90 | HR 104 | Temp 97.9°F | Resp 16 | Wt 218.6 lb

## 2018-10-24 DIAGNOSIS — J0100 Acute maxillary sinusitis, unspecified: Secondary | ICD-10-CM

## 2018-10-24 MED ORDER — AMOXICILLIN-POT CLAVULANATE 875-125 MG PO TABS: 1.0000 | ORAL_TABLET | Freq: Two times a day (BID) | ORAL | 0 refills | Status: DC

## 2018-10-24 NOTE — Patient Instructions (Signed)
Sinusitis, Adult Sinusitis is soreness and inflammation of your sinuses. Sinuses are hollow spaces in the bones around your face. They are located:  Around your eyes.  In the middle of your forehead.  Behind your nose.  In your cheekbones.  Your sinuses and nasal passages are lined with a stringy fluid (mucus). Mucus normally drains out of your sinuses. When your nasal tissues get inflamed or swollen, the mucus can get trapped or blocked so air cannot flow through your sinuses. This lets bacteria, viruses, and funguses grow, and that leads to infection. Follow these instructions at home: Medicines  Take, use, or apply over-the-counter and prescription medicines only as told by your doctor. These may include nasal sprays.  If you were prescribed an antibiotic medicine, take it as told by your doctor. Do not stop taking the antibiotic even if you start to feel better. Hydrate and Humidify  Drink enough water to keep your pee (urine) clear or pale yellow.  Use a cool mist humidifier to keep the humidity level in your home above 50%.  Breathe in steam for 10-15 minutes, 3-4 times a day or as told by your doctor. You can do this in the bathroom while a hot shower is running.  Try not to spend time in cool or dry air. Rest  Rest as much as possible.  Sleep with your head raised (elevated).  Make sure to get enough sleep each night. General instructions  Put a warm, moist washcloth on your face 3-4 times a day or as told by your doctor. This will help with discomfort.  Wash your hands often with soap and water. If there is no soap and water, use hand sanitizer.  Do not smoke. Avoid being around people who are smoking (secondhand smoke).  Keep all follow-up visits as told by your doctor. This is important. Contact a doctor if:  You have a fever.  Your symptoms get worse.  Your symptoms do not get better within 10 days. Get help right away if:  You have a very bad  headache.  You cannot stop throwing up (vomiting).  You have pain or swelling around your face or eyes.  You have trouble seeing.  You feel confused.  Your neck is stiff.  You have trouble breathing. This information is not intended to replace advice given to you by your health care provider. Make sure you discuss any questions you have with your health care provider. Document Released: 05/17/2008 Document Revised: 07/25/2016 Document Reviewed: 09/24/2015 Elsevier Interactive Patient Education  [employer].

## 2018-10-24 NOTE — Progress Notes (Signed)
Patient: Rachel Wells Female    DOB: 1971-05-22   47 y.o.   MRN: [id number] Visit Date: 10/24/2018  Today's Provider: Trinna Post, PA-C   Chief Complaint  Patient presents with  . URI   Subjective:    HPI Upper Respiratory Infection: Patient complains of symptoms of a URI, possible sinusitis. Symptoms include bilateral ear pain, congestion, cough and sore throat. Onset of symptoms was 1 week ago, gradually worsening since that time. She also c/o headache described as dull and sinus pressure for the past 1 week .  She is drinking plenty of fluids. Evaluation to date: none. Treatment to date: cough suppressants and decongestants.       Allergies  Allergen Reactions  . Shrimp [Shellfish Allergy] Swelling  . Neomycin-Bacitracin Zn-Polymyx Itching and Rash    REACTION: rash, itch     Current Outpatient Medications:  .  cetirizine (ZYRTEC) 10 MG tablet, Take by mouth., Disp: , Rfl:  .  losartan-hydrochlorothiazide (HYZAAR) 100-25 MG tablet, Take 0.5 tablets by mouth daily., Disp: 45 tablet, Rfl: 3 .  metoprolol succinate (TOPROL-XL) 50 MG 24 hr tablet, TAKE ONE TABLET BY MOUTH ONCE DAILY. TAKE WITH OR IMMEDIATELY FOLLOWING A MEAL., Disp: 90 tablet, Rfl: 3 .  MIRENA, 52 MG, 20 MCG/24HR IUD, , Disp: , Rfl:  .  MULTIPLE VITAMIN PO, Take 1 tablet by mouth daily., Disp: , Rfl:  .  omeprazole (PRILOSEC) 20 MG capsule, TAKE 1 CAPSULE BY MOUTH ONCE DAILY, Disp: 90 capsule, Rfl: 1 .  amoxicillin-clavulanate (AUGMENTIN) 875-125 MG tablet, Take 1 tablet by mouth 2 (two) times daily for 10 days., Disp: 20 tablet, Rfl: 0  Review of Systems  Constitutional: Negative.   HENT: Positive for congestion, ear pain, postnasal drip, sinus pressure and sinus pain.   Respiratory: Positive for cough.   Neurological: Positive for headaches.    Social History   Tobacco Use  . Smoking status: Never Smoker  . Smokeless tobacco: Never Used  Substance Use Topics  . Alcohol use: No   Alcohol/week: 0.0 standard drinks   Objective:   BP 136/90 (BP Location: Left Arm, Patient Position: Sitting, Cuff Size: Large)   Pulse (!) 104   Temp 97.9 F (36.6 C) (Oral)   Resp 16   Wt 218 lb 9.6 oz (99.2 kg)   SpO2 98%   BMI 37.52 kg/m  Vitals:   10/24/18 1010  BP: 136/90  Pulse: (!) 104  Resp: 16  Temp: 97.9 F (36.6 C)  TempSrc: Oral  SpO2: 98%  Weight: 218 lb 9.6 oz (99.2 kg)     Physical Exam  Constitutional: She is oriented to person, place, and time. She appears well-developed and well-nourished. No distress.  HENT:  Right Ear: External ear normal.  Left Ear: External ear normal.  Nose: Right sinus exhibits maxillary sinus tenderness and frontal sinus tenderness. Left sinus exhibits maxillary sinus tenderness and frontal sinus tenderness.  Mouth/Throat: Oropharynx is clear and moist. No oropharyngeal exudate, posterior oropharyngeal edema or posterior oropharyngeal erythema.  Tms opaque bilaterally   Eyes: Conjunctivae are normal. Right eye exhibits no discharge. Left eye exhibits no discharge.  Neck: Neck supple.  Cardiovascular: Normal rate and regular rhythm.  Pulmonary/Chest: Effort normal and breath sounds normal.  Lymphadenopathy:    She has cervical adenopathy.  Neurological: She is alert and oriented to person, place, and time.  Skin: Skin is warm and dry. She is not diaphoretic.  Psychiatric: She has a normal mood and  affect. Her behavior is normal.        Assessment & Plan:     1. Acute non-recurrent maxillary sinusitis  - amoxicillin-clavulanate (AUGMENTIN) 875-125 MG tablet; Take 1 tablet by mouth 2 (two) times daily for 10 days.  Dispense: 20 tablet; Refill: 0  Return if symptoms worsen or fail to improve.  The entirety of the information documented in the History of Present Illness, Review of Systems and Physical Exam were personally obtained by me. Portions of this information were initially documented by Lynford Humphrey, CMA and reviewed  by me for thoroughness and accuracy.         Trinna Post, PA-C  [facility]

## 2018-12-12 ENCOUNTER — Encounter: Payer: Self-pay | Admitting: Physician Assistant

## 2018-12-14 NOTE — Telephone Encounter (Signed)
LMTCB  Spoke with the pharmacy and the Losartan-Hydrochlorothiazide(Hyzaar) is back ordered per the pharmacy they can get a verbal to give patient the medications separate per Carles Collet this is ok

## 2018-12-15 ENCOUNTER — Encounter: Payer: Self-pay | Admitting: Podiatry

## 2018-12-15 ENCOUNTER — Ambulatory Visit (INDEPENDENT_AMBULATORY_CARE_PROVIDER_SITE_OTHER): Payer: BC Managed Care – PPO

## 2018-12-15 ENCOUNTER — Encounter

## 2018-12-15 ENCOUNTER — Ambulatory Visit: Payer: BC Managed Care – PPO | Admitting: Podiatry

## 2018-12-15 DIAGNOSIS — M722 Plantar fascial fibromatosis: Secondary | ICD-10-CM

## 2018-12-15 MED ORDER — MELOXICAM 15 MG PO TABS: 15.0000 mg | ORAL_TABLET | Freq: Every day | ORAL | 1 refills | Status: DC

## 2018-12-15 MED ORDER — METHYLPREDNISOLONE 4 MG PO TBPK: ORAL_TABLET | ORAL | 0 refills | Status: DC

## 2018-12-18 NOTE — Progress Notes (Signed)
   Subjective: 48 year old female presenting today as a new patient with a chief complaint of stabbing pain to the plantar right heel that began about two years ago. She states the pain has worsened over the past 9 months. Walking and standing increases the pain. She states her symptoms are the worst in the morning when she first gets up or when she stands after sitting for a prolonged period of time. She has been using gel heel pads, inserts and taking Ibuprofen for pain. Patient is here for further evaluation and treatment.   Past Medical History:  Diagnosis Date  . BP (high blood pressure) 03/22/2008  . Fatigue 04/18/2015  . Fibroid 04/18/2015  . Gastroesophageal reflux disease 12/03/2016  . Headache, migraine 03/22/2008  . Hypertension      Objective: Physical Exam General: The patient is alert and oriented x3 in no acute distress.  Dermatology: Skin is warm, dry and supple bilateral lower extremities. Negative for open lesions or macerations bilateral.   Vascular: Dorsalis Pedis and Posterior Tibial pulses palpable bilateral.  Capillary fill time is immediate to all digits.  Neurological: Epicritic and protective threshold intact bilateral.   Musculoskeletal: Tenderness to palpation to the plantar aspect of the right heel along the plantar fascia. All other joints range of motion within normal limits bilateral. Strength 5/5 in all groups bilateral.   Radiographic exam: Normal osseous mineralization. Joint spaces preserved. No fracture/dislocation/boney destruction. No other soft tissue abnormalities or radiopaque foreign bodies.   Assessment: 1. Plantar fasciitis right  Plan of Care:  1. Patient evaluated. Xrays reviewed.   2. Injection of 0.5cc Celestone soluspan injected into the right plantar fascia  3. Rx for Medrol Dose Pack placed 4. Rx for Meloxicam ordered for patient. 5. Plantar fascial band(s) dispensed 6. Instructed patient regarding therapies and modalities at home  to alleviate symptoms.  7. Appointment with Liliane Channel, Pedorthist, for custom molded orthotics.  8. Return to clinic in 8 weeks.     Edrick Kins, DPM [facility]  Dr. Edrick Kins, DPM    [address], [place] [address]                Office [phone]  Fax [phone]

## 2018-12-31 ENCOUNTER — Other Ambulatory Visit: Payer: Self-pay | Admitting: Physician Assistant

## 2018-12-31 DIAGNOSIS — I10 Essential (primary) hypertension: Secondary | ICD-10-CM

## 2019-01-03 ENCOUNTER — Ambulatory Visit: Payer: BC Managed Care – PPO | Admitting: Physician Assistant

## 2019-01-03 ENCOUNTER — Encounter: Payer: Self-pay | Admitting: Physician Assistant

## 2019-01-03 ENCOUNTER — Ambulatory Visit (INDEPENDENT_AMBULATORY_CARE_PROVIDER_SITE_OTHER): Payer: BC Managed Care – PPO | Admitting: Orthotics

## 2019-01-03 VITALS — BP 126/85 | HR 106 | Temp 98.0°F | Resp 16 | Wt 219.0 lb

## 2019-01-03 DIAGNOSIS — R05 Cough: Secondary | ICD-10-CM | POA: Diagnosis not present

## 2019-01-03 DIAGNOSIS — L42 Pityriasis rosea: Secondary | ICD-10-CM | POA: Diagnosis not present

## 2019-01-03 DIAGNOSIS — M722 Plantar fascial fibromatosis: Secondary | ICD-10-CM | POA: Diagnosis not present

## 2019-01-03 DIAGNOSIS — J111 Influenza due to unidentified influenza virus with other respiratory manifestations: Secondary | ICD-10-CM

## 2019-01-03 DIAGNOSIS — R059 Cough, unspecified: Secondary | ICD-10-CM

## 2019-01-03 MED ORDER — HYDROCODONE-HOMATROPINE 5-1.5 MG/5ML PO SYRP: 5.0000 mL | ORAL_SOLUTION | Freq: Three times a day (TID) | ORAL | 0 refills | Status: DC | PRN

## 2019-01-03 NOTE — Patient Instructions (Signed)

## 2019-01-03 NOTE — Progress Notes (Signed)
Patient: Rachel Wells Female    DOB: 01-Apr-1971   48 y.o.   MRN: [id number] Visit Date: 01/03/2019  Today's Provider: Mar Daring, PA-C   Chief Complaint  Patient presents with  . Cough   Subjective:     Cough  This is a new problem. The current episode started in the past 7 days (3 days ago). The cough is productive of sputum. Associated symptoms include chills, a fever, myalgias, nasal congestion, a rash, rhinorrhea, shortness of breath and sweats. Pertinent negatives include no chest pain, ear congestion, ear pain, headaches, heartburn, hemoptysis, postnasal drip, sore throat, weight loss or wheezing.   Patient has had cough and low grade fever for 3 days. Patient states cough is slightly productive. Patient also has symptoms of rash on upper legs x2 days. Patient states rash is red, no blisters, does not itch or burn. Patient also has symptoms of chills, sweats, muscle aches, nasal congestion, runny nose, and shortness of breath. Patient has been taking otc coricidin and Tylenol with only mild relief.   Allergies  Allergen Reactions  . Shrimp [Shellfish Allergy] Swelling  . Neomycin-Bacitracin Zn-Polymyx Itching and Rash    REACTION: rash, itch     Current Outpatient Medications:  .  cetirizine (ZYRTEC) 10 MG tablet, Take by mouth., Disp: , Rfl:  .  ELDERBERRY PO, Take by mouth daily., Disp: , Rfl:  .  losartan-hydrochlorothiazide (HYZAAR) 100-25 MG tablet, Take 0.5 tablets by mouth daily., Disp: 45 tablet, Rfl: 3 .  meloxicam (MOBIC) 15 MG tablet, Take 1 tablet (15 mg total) by mouth daily., Disp: 60 tablet, Rfl: 1 .  metoprolol succinate (TOPROL-XL) 50 MG 24 hr tablet, TAKE 1 TABLET BY MOUTH ONCE DAILY IMMEDIATELY FOLLOWING A MEAL, Disp: 90 tablet, Rfl: 1 .  MIRENA, 52 MG, 20 MCG/24HR IUD, , Disp: , Rfl:  .  MULTIPLE VITAMIN PO, Take 1 tablet by mouth daily., Disp: , Rfl:  .  omeprazole (PRILOSEC) 20 MG capsule, TAKE 1 CAPSULE BY MOUTH ONCE DAILY, Disp: 90  capsule, Rfl: 1 .  methylPREDNISolone (MEDROL DOSEPAK) 4 MG TBPK tablet, 6 day dose pack - take as directed (Patient not taking: Reported on 01/03/2019), Disp: 21 tablet, Rfl: 0  Review of Systems  Constitutional: Positive for chills and fever. Negative for appetite change, fatigue and weight loss.  HENT: Positive for congestion and rhinorrhea. Negative for ear pain, postnasal drip, sinus pain and sore throat.   Respiratory: Positive for cough and shortness of breath. Negative for hemoptysis, chest tightness and wheezing.   Cardiovascular: Negative for chest pain and palpitations.  Gastrointestinal: Negative for abdominal pain, heartburn, nausea and vomiting.  Musculoskeletal: Positive for myalgias.  Skin: Positive for rash.  Neurological: Negative for dizziness, weakness and headaches.    Social History   Tobacco Use  . Smoking status: Never Smoker  . Smokeless tobacco: Never Used  Substance Use Topics  . Alcohol use: No    Alcohol/week: 0.0 standard drinks      Objective:   BP 126/85 (BP Location: Left Arm, Patient Position: Sitting, Cuff Size: Large)   Pulse (!) 106   Temp 98 F (36.7 C) (Oral)   Resp 16   Wt 219 lb (99.3 kg)   SpO2 97%   BMI 37.59 kg/m  Vitals:   01/03/19 1117  BP: 126/85  Pulse: (!) 106  Resp: 16  Temp: 98 F (36.7 C)  TempSrc: Oral  SpO2: 97%  Weight: 219 lb (99.3 kg)  Physical Exam Vitals signs reviewed.  Constitutional:      General: She is not in acute distress.    Appearance: She is well-developed. She is not diaphoretic.  HENT:     Head: Normocephalic and atraumatic.     Right Ear: Hearing, tympanic membrane, ear canal and external ear normal.     Left Ear: Hearing, tympanic membrane, ear canal and external ear normal.     Nose: Nose normal.     Mouth/Throat:     Pharynx: Uvula midline. No oropharyngeal exudate.  Eyes:     General: No scleral icterus.       Right eye: No discharge.        Left eye: No discharge.      Conjunctiva/sclera: Conjunctivae normal.     Pupils: Pupils are equal, round, and reactive to light.  Neck:     Musculoskeletal: Normal range of motion and neck supple.     Thyroid: No thyromegaly.     Trachea: No tracheal deviation.  Cardiovascular:     Rate and Rhythm: Normal rate and regular rhythm.     Heart sounds: Normal heart sounds. No murmur. No friction rub. No gallop.   Pulmonary:     Effort: Pulmonary effort is normal. No respiratory distress.     Breath sounds: Normal breath sounds. No stridor. No wheezing or rales.  Lymphadenopathy:     Cervical: No cervical adenopathy.  Skin:    General: Skin is warm and dry.        Assessment & Plan    1. Influenza Patient with sudden onset of symptoms on Sunday with fevers around 101 degrees, myalgias, and cough. Suspect influenza (unable to test due to being out of testing in the office). Advised to continue OTC symptom management, alternate tylenol and IBU prn for fevers. Push fluids. Call if worsening.   2. Pityriasis rosea Rash started as herald patch on left thigh then spread. No itching or burning. Suspect pityriasis rosea secondary to influenza. Expect self resolution.   3. Cough Worsening symptoms that has not responded to OTC medications. Will give Hycodan cough syrup as below for nighttime cough. Drowsiness precautions given to patient. Stay well hydrated. Use delsym, robitussin OR mucinex for daytime cough. - HYDROcodone-homatropine (HYCODAN) 5-1.5 MG/5ML syrup; Take 5 mLs by mouth every 8 (eight) hours as needed for cough.  Dispense: 180 mL; Refill: 0     Mar Daring, PA-C  [facility]

## 2019-01-03 NOTE — Progress Notes (Signed)

## 2019-01-24 ENCOUNTER — Ambulatory Visit: Payer: BC Managed Care – PPO | Admitting: Orthotics

## 2019-01-24 DIAGNOSIS — M722 Plantar fascial fibromatosis: Secondary | ICD-10-CM

## 2019-01-24 NOTE — Progress Notes (Signed)
Patient came in today to pick up custom made foot orthotics.  The goals were accomplished and the patient reported no dissatisfaction with said orthotics.  Patient was advised of breakin period and how to report any issues. 

## 2019-01-31 ENCOUNTER — Telehealth: Payer: Self-pay | Admitting: *Deleted

## 2019-01-31 DIAGNOSIS — I10 Essential (primary) hypertension: Secondary | ICD-10-CM

## 2019-01-31 MED ORDER — LOSARTAN POTASSIUM 50 MG PO TABS: 50.0000 mg | ORAL_TABLET | Freq: Every day | ORAL | 1 refills | Status: DC

## 2019-01-31 MED ORDER — HYDROCHLOROTHIAZIDE 12.5 MG PO CAPS: 12.5000 mg | ORAL_CAPSULE | Freq: Every day | ORAL | 1 refills | Status: DC

## 2019-01-31 NOTE — Telephone Encounter (Signed)
[facility] called office stating pt wants to change from losartan-HCTZ 100-25 mg to losartan 50 mg qd and HCTZ 12.5 mg qd. Please advise?

## 2019-01-31 NOTE — Telephone Encounter (Signed)
OK 

## 2019-02-09 ENCOUNTER — Ambulatory Visit: Payer: BC Managed Care – PPO | Admitting: Podiatry

## 2019-02-09 ENCOUNTER — Encounter: Payer: Self-pay | Admitting: Podiatry

## 2019-02-09 DIAGNOSIS — M722 Plantar fascial fibromatosis: Secondary | ICD-10-CM | POA: Diagnosis not present

## 2019-02-12 NOTE — Progress Notes (Signed)
   Subjective: 48 year old female presenting today for follow up evaluation of plantar fasciitis of the right foot. She states the pain has not gotten any better and seems to be worse. She reports associated radiating pain throughout the foot. Walking increases the pain. She has been using the fascial brace and taking Meloxicam for treatment. Patient is here for further evaluation and treatment.   Past Medical History:  Diagnosis Date  . BP (high blood pressure) 03/22/2008  . Fatigue 04/18/2015  . Fibroid 04/18/2015  . Gastroesophageal reflux disease 12/03/2016  . Headache, migraine 03/22/2008  . Hypertension      Objective: Physical Exam General: The patient is alert and oriented x3 in no acute distress.  Dermatology: Skin is warm, dry and supple bilateral lower extremities. Negative for open lesions or macerations bilateral.   Vascular: Dorsalis Pedis and Posterior Tibial pulses palpable bilateral.  Capillary fill time is immediate to all digits.  Neurological: Epicritic and protective threshold intact bilateral.   Musculoskeletal: Tenderness to palpation to the plantar aspect of the right heel along the plantar fascia. All other joints range of motion within normal limits bilateral. Strength 5/5 in all groups bilateral.   Assessment: 1. Plantar fasciitis right  Plan of Care:  1. Patient evaluated.   2. Injection of 0.5cc Celestone soluspan injected into the right plantar fascia  3. Continue using custom orthotics. Discontinue using plantar fascial brace.  4. Recommended new shoes from [employer].  5. Continue taking Meloxicam.  6. Return to clinic in 4 weeks.   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     Edrick Kins, DPM [facility]  Dr. Edrick Kins, DPM    [address], [place] [address]                Office [phone]  Fax [phone]

## 2019-03-09 ENCOUNTER — Ambulatory Visit: Payer: BC Managed Care – PPO | Admitting: Podiatry

## 2019-03-16 ENCOUNTER — Ambulatory Visit: Payer: BC Managed Care – PPO | Admitting: Podiatry

## 2019-03-31 IMAGING — US US BREAST*L* LIMITED INC AXILLA
1 series · 10 of 10 positions shown · non-contrast
Comparison: Previous exam(s).

CLINICAL DATA: Callback from screening mammogram for possible mass
and asymmetry of left breast.

EXAM:
2D DIGITAL DIAGNOSTIC LEFT MAMMOGRAM WITH CAD AND ADJUNCT TOMO
ULTRASOUND LEFT BREAST

[Series 1: us breast*left* limited inc axilla · 0.06mm/px · 10 of 10 slices shown]
[im 1/10]
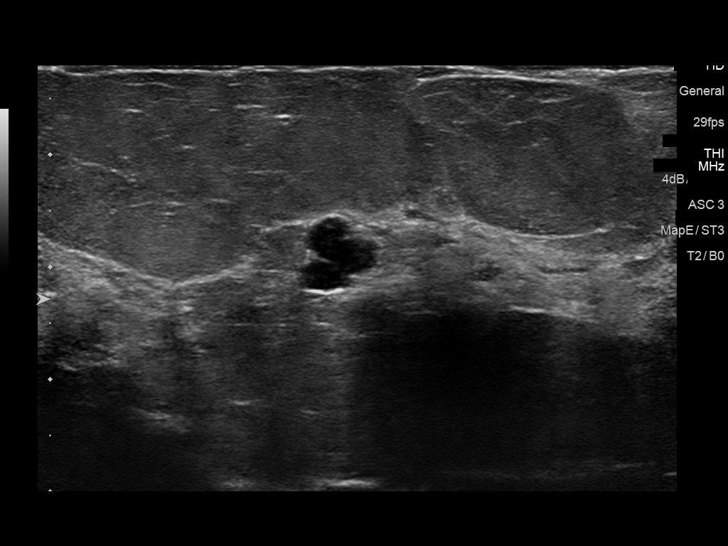
[im 2/10]
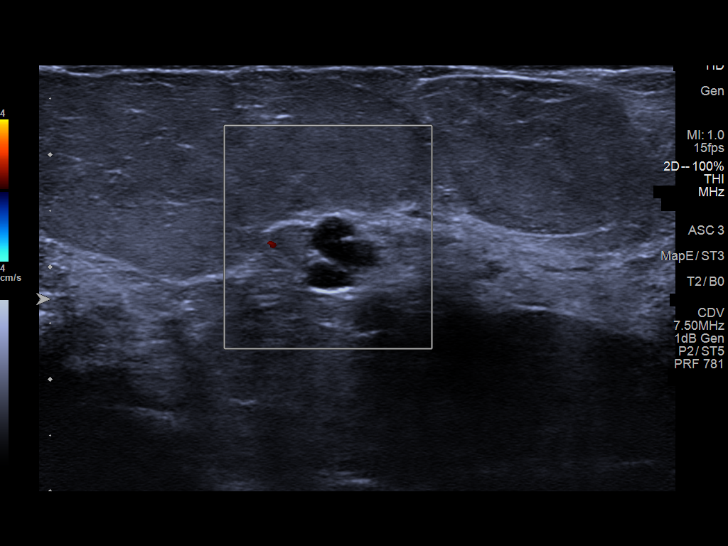
[im 3/10]
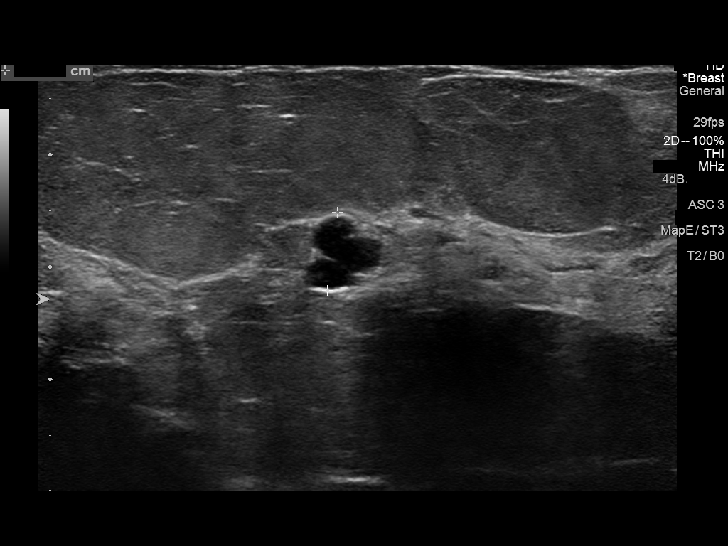
[im 4/10]
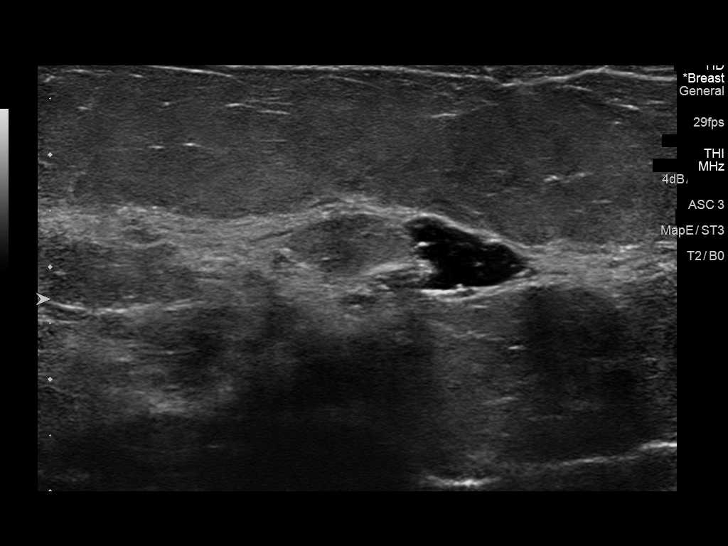
[im 5/10]
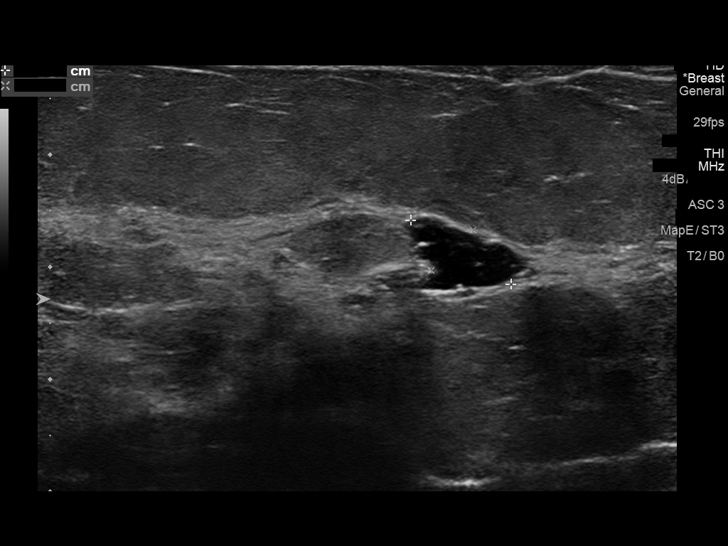
[im 6/10]
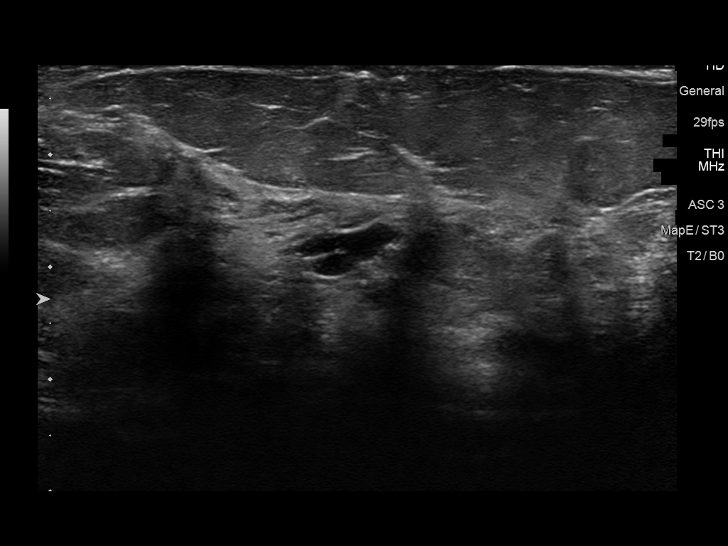
[im 7/10]
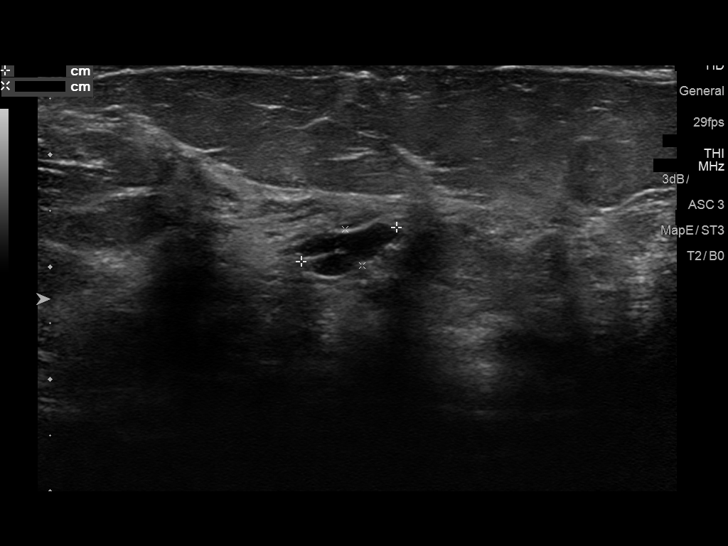
[im 8/10]
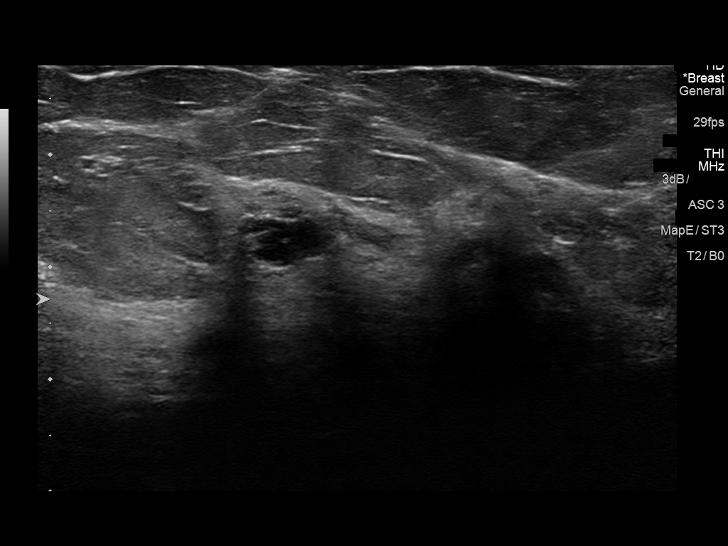
[im 9/10]
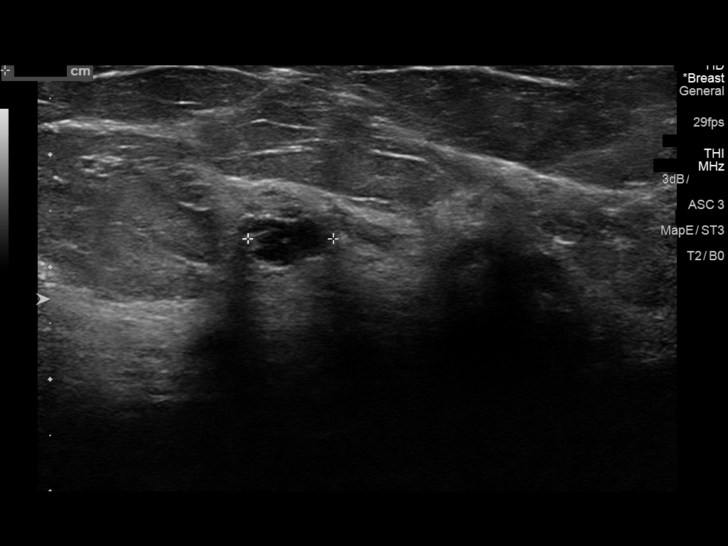
[im 10/10]
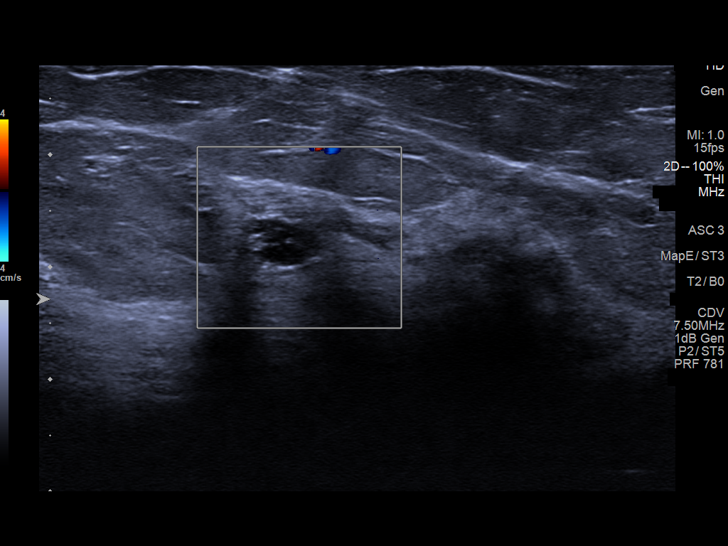

[10 of 10 positions shown; findings below may reference images not displayed]

ACR Breast Density Category b: There are scattered areas of
fibroglandular density.
FINDINGS: Lateral view of left breast, spot compression left CC and MLO views
are submitted. Previously questioned asymmetry and mass are not as
well seen on additional views.

Mammographic images were processed with CAD.

Targeted ultrasound is performed, showing complicated cyst at the
left breast 12 o'clock 3 cm from nipple measuring 1.1 cm. There is a
complicated cyst at the left breast 2 o'clock cm from nipple
measuring 0.76 cm. These may correlate to the mammographic
questioned areas.
IMPRESSION: Probable benign findings.

RECOMMENDATION:
Six-month follow-up mammogram and ultrasound of left breast.

I have discussed the findings and recommendations with the patient.
Results were also provided in writing at the conclusion of the
visit. If applicable, a reminder letter will be sent to the patient
regarding the next appointment.

BI-RADS CATEGORY  3: Probably benign.

## 2019-04-26 ENCOUNTER — Ambulatory Visit (INDEPENDENT_AMBULATORY_CARE_PROVIDER_SITE_OTHER): Payer: BC Managed Care – PPO | Admitting: Physician Assistant

## 2019-04-26 ENCOUNTER — Other Ambulatory Visit: Payer: BC Managed Care – PPO

## 2019-04-26 ENCOUNTER — Telehealth: Payer: Self-pay | Admitting: Hematology

## 2019-04-26 ENCOUNTER — Encounter: Payer: Self-pay | Admitting: Physician Assistant

## 2019-04-26 VITALS — BP 145/83 | HR 93 | Temp 98.2°F | Wt 220.0 lb

## 2019-04-26 DIAGNOSIS — Z6839 Body mass index (BMI) 39.0-39.9, adult: Secondary | ICD-10-CM

## 2019-04-26 DIAGNOSIS — Z20822 Contact with and (suspected) exposure to covid-19: Secondary | ICD-10-CM

## 2019-04-26 DIAGNOSIS — R509 Fever, unspecified: Secondary | ICD-10-CM

## 2019-04-26 DIAGNOSIS — R059 Cough, unspecified: Secondary | ICD-10-CM

## 2019-04-26 DIAGNOSIS — J029 Acute pharyngitis, unspecified: Secondary | ICD-10-CM | POA: Diagnosis not present

## 2019-04-26 DIAGNOSIS — R6889 Other general symptoms and signs: Secondary | ICD-10-CM

## 2019-04-26 DIAGNOSIS — R05 Cough: Secondary | ICD-10-CM | POA: Diagnosis not present

## 2019-04-26 DIAGNOSIS — I10 Essential (primary) hypertension: Secondary | ICD-10-CM

## 2019-04-26 DIAGNOSIS — J0140 Acute pansinusitis, unspecified: Secondary | ICD-10-CM | POA: Diagnosis not present

## 2019-04-26 MED ORDER — AMOXICILLIN-POT CLAVULANATE 875-125 MG PO TABS: 1.0000 | ORAL_TABLET | Freq: Two times a day (BID) | ORAL | 0 refills | Status: DC

## 2019-04-26 MED ORDER — BENZONATATE 200 MG PO CAPS: 200.0000 mg | ORAL_CAPSULE | Freq: Two times a day (BID) | ORAL | 0 refills | Status: DC | PRN

## 2019-04-26 NOTE — Telephone Encounter (Signed)
Patient scheduled for today, orders placed.

## 2019-04-26 NOTE — Progress Notes (Signed)
Virtual Visit via Video Note  I connected with Rachel Wells on 04/26/19 at 11:00 AM EDT by a video enabled telemedicine application and verified that I am speaking with the correct person using two identifiers.  Location: Patient: home Provider: [facility]   I discussed the limitations of evaluation and management by telemedicine and the availability of in person appointments. The patient expressed understanding and agreed to proceed.   Mar Daring, PA-C   Patient: Rachel Wells Female    DOB: [birth year]   48 y.o.   MRN: [id number] Visit Date: 04/26/2019  Today's Provider: Mar Daring, PA-C   No chief complaint on file.  Subjective:     Patient with c/o of cough,sore throat, fever 100.5. Patient reports that her symptoms started last week like allergies symptoms and the fever hit on Monday night. She then had low grade fever again on Tuesday night, Wednesday morning and night then awoke again today with low grade fever. She is taking Tylenol, coricidin chest and cough congestion, Nyquil high blood cold and Flu at bedtime. She does report the fever is responding to Tylenol. She reports this does not feel exactly like her normal sinus infections as she normally does not get fever and she does not have as much head pressure. Her throat soreness is main complaint then fevers. No known strep contact. No known Covid contact. Reports she did babysit her granddaughter last weekend. Granddaughter had "cold" symptoms while she was there.   Allergies  Allergen Reactions  . Shrimp [Shellfish Allergy] Swelling  . Neomycin-Bacitracin Zn-Polymyx Itching and Rash    REACTION: rash, itch     Current Outpatient Medications:  .  cetirizine (ZYRTEC) 10 MG tablet, Take by mouth., Disp: , Rfl:  .  ELDERBERRY PO, Take by mouth daily., Disp: , Rfl:  .  hydrochlorothiazide (MICROZIDE) 12.5 MG capsule, Take 1 capsule (12.5 mg total) by mouth daily., Disp: 90 capsule, Rfl: 1 .   HYDROcodone-homatropine (HYCODAN) 5-1.5 MG/5ML syrup, Take 5 mLs by mouth every 8 (eight) hours as needed for cough., Disp: 180 mL, Rfl: 0 .  losartan (COZAAR) 50 MG tablet, Take 1 tablet (50 mg total) by mouth daily., Disp: 90 tablet, Rfl: 1 .  losartan-hydrochlorothiazide (HYZAAR) 100-25 MG tablet, Take 0.5 tablets by mouth daily., Disp: 45 tablet, Rfl: 3 .  methylPREDNISolone (MEDROL DOSEPAK) 4 MG TBPK tablet, 6 day dose pack - take as directed (Patient not taking: Reported on 01/03/2019), Disp: 21 tablet, Rfl: 0 .  metoprolol succinate (TOPROL-XL) 50 MG 24 hr tablet, TAKE 1 TABLET BY MOUTH ONCE DAILY IMMEDIATELY FOLLOWING A MEAL, Disp: 90 tablet, Rfl: 1 .  MIRENA, 52 MG, 20 MCG/24HR IUD, , Disp: , Rfl:  .  MULTIPLE VITAMIN PO, Take 1 tablet by mouth daily., Disp: , Rfl:  .  omeprazole (PRILOSEC) 20 MG capsule, TAKE 1 CAPSULE BY MOUTH ONCE DAILY, Disp: 90 capsule, Rfl: 1  Review of Systems  Constitutional: Positive for chills, fatigue and fever.  HENT: Positive for congestion, postnasal drip, sinus pressure, sore throat and trouble swallowing. Negative for ear pain, rhinorrhea and sinus pain.   Respiratory: Positive for cough. Negative for chest tightness, shortness of breath and wheezing.   Cardiovascular: Negative for chest pain, palpitations and leg swelling.  Neurological: Negative.     Social History   Tobacco Use  . Smoking status: Never Smoker  . Smokeless tobacco: Never Used  Substance Use Topics  . Alcohol use: No    Alcohol/week: 0.0 standard drinks  Objective:   There were no vitals taken for this visit. There were no vitals filed for this visit.   Physical Exam Constitutional:      General: She is not in acute distress.    Appearance: She is obese. She is ill-appearing.  HENT:     Head: Normocephalic and atraumatic.  Pulmonary:     Effort: No respiratory distress.  Neurological:     Mental Status: She is alert.  Psychiatric:        Mood and Affect: Mood  normal.        Behavior: Behavior normal.        Thought Content: Thought content normal.        Judgment: Judgment normal.        Assessment & Plan    1. Acute non-recurrent pansinusitis Will prescribe augmentin as below for possible sinusitis/strep, but will also start symptom monitoring for Covid as below. Quarantine instructions sent to patient via mychart. Will also see if patient can be tested for possible covid due to symptoms and being high risk for complications due to obesity and HTN. She is in agreement with plan. Call if symptoms worsen.  - amoxicillin-clavulanate (AUGMENTIN) 875-125 MG tablet; Take 1 tablet by mouth 2 (two) times daily.  Dispense: 20 tablet; Refill: 0 - MyChart COVID-19 home monitoring program; Future - Temperature monitoring; Future  2. Cough See above medical treatment plan. - benzonatate (TESSALON) 200 MG capsule; Take 1 capsule (200 mg total) by mouth 2 (two) times daily as needed for cough.  Dispense: 20 capsule; Refill: 0 - MyChart COVID-19 home monitoring program; Future - Temperature monitoring; Future  3. Sore throat See above medical treatment plan. - MyChart COVID-19 home monitoring program; Future - Temperature monitoring; Future  4. Fever, unspecified fever cause See above medical treatment plan. - MyChart COVID-19 home monitoring program; Future - Temperature monitoring; Future  5. Suspected Covid-19 Virus Infection See above medical treatment plan. - MyChart COVID-19 home monitoring program; Future - Temperature monitoring; Future  6. Class 2 severe obesity due to excess calories with serious comorbidity and body mass index (BMI) of 39.0 to 39.9 in adult [facility]) Obesity makes patient higher risk for complications secondary to covid.   7. Essential hypertension HTN also increases patient's risk for complications from Covid.     I discussed the assessment and treatment plan with the patient. The patient was provided an opportunity  to ask questions and all were answered. The patient agreed with the plan and demonstrated an understanding of the instructions.   The patient was advised to call back or seek an in-person evaluation if the symptoms worsen or if the condition fails to improve as anticipated.  I provided 18 minutes of non-face-to-face time during this encounter.   Mar Daring, PA-C  [facility]

## 2019-04-26 NOTE — Telephone Encounter (Signed)
See other telephone encounter.  Opened this on in error

## 2019-04-26 NOTE — Patient Instructions (Signed)
This information is directly available on the CDC website: RunningShows.co.za.html    Source:CDC Reference to specific commercial products, manufacturers, companies, or trademarks does not constitute its endorsement or recommendation by the [place], [place], or Centers for [employer] and Prevention.    Person Under Monitoring Name: Daylin Eads  Location: [place] [address]   Infection Prevention Recommendations for Individuals Confirmed to have, or Being Evaluated for, 2019 Novel Coronavirus (COVID-19) Infection Who Receive Care at Home  Individuals who are confirmed to have, or are being evaluated for, COVID-19 should follow the prevention steps below until a healthcare provider or local or state health department says they can return to normal activities.  Stay home except to get medical care You should restrict activities outside your home, except for getting medical care. Do not go to work, school, or public areas, and do not use public transportation or taxis.  Call ahead before visiting your doctor Before your medical appointment, call the healthcare provider and tell them that you have, or are being evaluated for, COVID-19 infection. This will help the healthcare provider's office take steps to keep other people from getting infected. Ask your healthcare provider to call the local or state health department.  Monitor your symptoms Seek prompt medical attention if your illness is worsening (e.g., difficulty breathing). Before going to your medical appointment, call the healthcare provider and tell them that you have, or are being evaluated for, COVID-19 infection. Ask your healthcare provider to call the local or state health department.  Wear a facemask You should wear a facemask that covers your nose and mouth when you are in the same room with other  people and when you visit a healthcare provider. People who live with or visit you should also wear a facemask while they are in the same room with you.  Separate yourself from other people in your home As much as possible, you should stay in a different room from other people in your home. Also, you should use a separate bathroom, if available.  Avoid sharing household items You should not share dishes, drinking glasses, cups, eating utensils, towels, bedding, or other items with other people in your home. After using these items, you should wash them thoroughly with soap and water.  Cover your coughs and sneezes Cover your mouth and nose with a tissue when you cough or sneeze, or you can cough or sneeze into your sleeve. Throw used tissues in a lined trash can, and immediately wash your hands with soap and water for at least 20 seconds or use an alcohol-based hand rub.  Wash your [facility] your hands often and thoroughly with soap and water for at least 20 seconds. You can use an alcohol-based hand sanitizer if soap and water are not available and if your hands are not visibly dirty. Avoid touching your eyes, nose, and mouth with unwashed hands.   Prevention Steps for Caregivers and Household Members of Individuals Confirmed to have, or Being Evaluated for, COVID-19 Infection Being Cared for in the Home  If you live with, or provide care at home for, a person confirmed to have, or being evaluated for, COVID-19 infection please follow these guidelines to prevent infection:  Follow healthcare provider's instructions Make sure that you understand and can help the patient follow any healthcare provider instructions for all care.  Provide for the patient's basic needs You should help the patient with basic needs in the home and  provide support for getting groceries, prescriptions, and other personal needs.  Monitor the patient's symptoms If they are getting sicker, call his or her  medical provider and tell them that the patient has, or is being evaluated for, COVID-19 infection. This will help the healthcare provider's office take steps to keep other people from getting infected. Ask the healthcare provider to call the local or state health department.  Limit the number of people who have contact with the patient  If possible, have only one caregiver for the patient.  Other household members should stay in another home or place of residence. If this is not possible, they should stay  in another room, or be separated from the patient as much as possible. Use a separate bathroom, if available.  Restrict visitors who do not have an essential need to be in the home.  Keep older adults, very young children, and other sick people away from the patient Keep older adults, very young children, and those who have compromised immune systems or chronic health conditions away from the patient. This includes people with chronic heart, lung, or kidney conditions, diabetes, and cancer.  Ensure good ventilation Make sure that shared spaces in the home have good air flow, such as from an air conditioner or an opened window, weather permitting.  Wash your hands often  Wash your hands often and thoroughly with soap and water for at least 20 seconds. You can use an alcohol based hand sanitizer if soap and water are not available and if your hands are not visibly dirty.  Avoid touching your eyes, nose, and mouth with unwashed hands.  Use disposable paper towels to dry your hands. If not available, use dedicated cloth towels and replace them when they become wet.  Wear a facemask and gloves  Wear a disposable facemask at all times in the room and gloves when you touch or have contact with the patient's blood, body fluids, and/or secretions or excretions, such as sweat, saliva, sputum, nasal mucus, vomit, urine, or feces.  Ensure the mask fits over your nose and mouth tightly, and do  not touch it during use.  Throw out disposable facemasks and gloves after using them. Do not reuse.  Wash your hands immediately after removing your facemask and gloves.  If your personal clothing becomes contaminated, carefully remove clothing and launder. Wash your hands after handling contaminated clothing.  Place all used disposable facemasks, gloves, and other waste in a lined container before disposing them with other household waste.  Remove gloves and wash your hands immediately after handling these items.  Do not share dishes, glasses, or other household items with the patient  Avoid sharing household items. You should not share dishes, drinking glasses, cups, eating utensils, towels, bedding, or other items with a patient who is confirmed to have, or being evaluated for, COVID-19 infection.  After the person uses these items, you should wash them thoroughly with soap and water.  Wash laundry thoroughly  Immediately remove and wash clothes or bedding that have blood, body fluids, and/or secretions or excretions, such as sweat, saliva, sputum, nasal mucus, vomit, urine, or feces, on them.  Wear gloves when handling laundry from the patient.  Read and follow directions on labels of laundry or clothing items and detergent. In general, wash and dry with the warmest temperatures recommended on the label.  Clean all areas the individual has used often  Clean all touchable surfaces, such as counters, tabletops, doorknobs, bathroom fixtures, toilets, phones, keyboards,  tablets, and bedside tables, every day. Also, clean any surfaces that may have blood, body fluids, and/or secretions or excretions on them.  Wear gloves when cleaning surfaces the patient has come in contact with.  Use a diluted bleach solution (e.g., dilute bleach with 1 part bleach and 10 parts water) or a household disinfectant with a label that says EPA-registered for coronaviruses. To make a bleach solution at  home, add 1 tablespoon of bleach to 1 quart (4 cups) of water. For a larger supply, add  cup of bleach to 1 gallon (16 cups) of water.  Read labels of cleaning products and follow recommendations provided on product labels. Labels contain instructions for safe and effective use of the cleaning product including precautions you should take when applying the product, such as wearing gloves or eye protection and making sure you have good ventilation during use of the product.  Remove gloves and wash hands immediately after cleaning.  Monitor yourself for signs and symptoms of illness Caregivers and household members are considered close contacts, should monitor their health, and will be asked to limit movement outside of the home to the extent possible. Follow the monitoring steps for close contacts listed on the symptom monitoring form.   ? If you have additional questions, contact your local health department or call the epidemiologist on call at [phone] (available 24/7). ? This guidance is subject to change. For the most up-to-date guidance from [facility], please refer to their website: YouBlogs.pl

## 2019-04-30 LAB — NOVEL CORONAVIRUS, NAA: SARS-CoV-2, NAA: NOT DETECTED

## 2019-05-16 ENCOUNTER — Encounter: Payer: Self-pay | Admitting: Physician Assistant

## 2019-05-16 DIAGNOSIS — J0140 Acute pansinusitis, unspecified: Secondary | ICD-10-CM

## 2019-05-16 MED ORDER — PREDNISONE 20 MG PO TABS: 20.0000 mg | ORAL_TABLET | Freq: Every day | ORAL | 0 refills | Status: DC

## 2019-05-16 MED ORDER — AMOXICILLIN-POT CLAVULANATE 875-125 MG PO TABS: 1.0000 | ORAL_TABLET | Freq: Two times a day (BID) | ORAL | 0 refills | Status: DC

## 2019-05-16 NOTE — Addendum Note (Signed)
Addended by: Mar Daring on: 05/16/2019 02:15 PM   Modules accepted: Orders

## 2019-06-12 ENCOUNTER — Other Ambulatory Visit: Payer: Self-pay | Admitting: Podiatry

## 2019-06-12 NOTE — Telephone Encounter (Signed)
Please ask Dr. Amalia Hailey thanks

## 2019-06-21 ENCOUNTER — Other Ambulatory Visit: Payer: Self-pay | Admitting: Podiatry

## 2019-06-30 ENCOUNTER — Other Ambulatory Visit: Payer: Self-pay | Admitting: Physician Assistant

## 2019-06-30 DIAGNOSIS — I10 Essential (primary) hypertension: Secondary | ICD-10-CM

## 2019-07-02 ENCOUNTER — Other Ambulatory Visit: Payer: Self-pay | Admitting: Physician Assistant

## 2019-07-02 DIAGNOSIS — I10 Essential (primary) hypertension: Secondary | ICD-10-CM

## 2019-07-09 IMAGING — US US BREAST*L* LIMITED INC AXILLA
1 series · 9 of 9 positions shown · non-contrast
Comparison: Previous exam(s).

CLINICAL DATA: Followup complicated cysts in the 12 o'clock and 2
o'clock positions of the left breast and probable corresponding
mammographic asymmetries..

EXAM:
DIGITAL DIAGNOSTIC LEFT MAMMOGRAM WITH CAD AND TOMO
ULTRASOUND LEFT BREAST

[Series 1: us breast*left* limited inc axilla · 0.07mm/px · 9 of 9 slices shown]
[im 1/9]
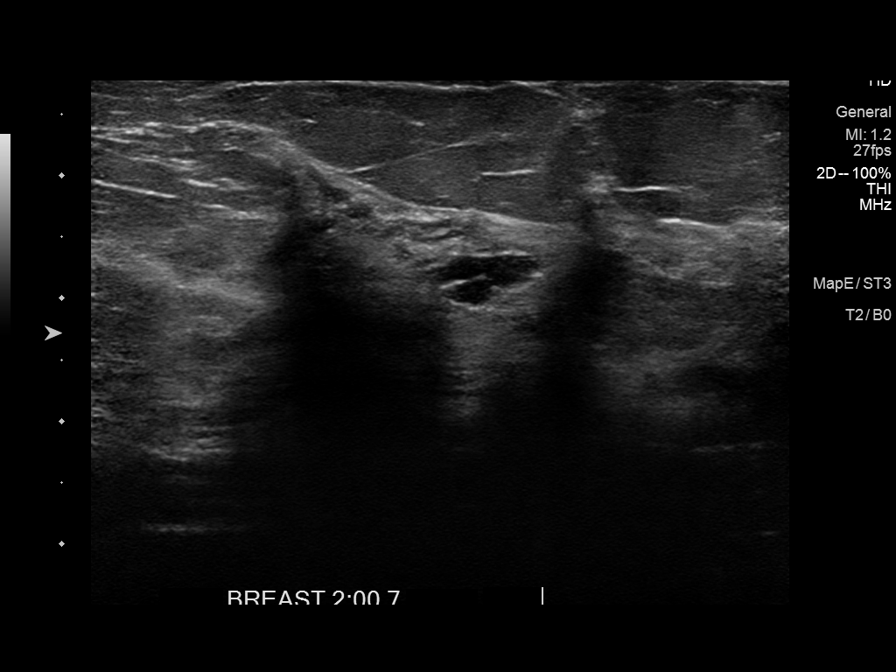
[im 2/9]
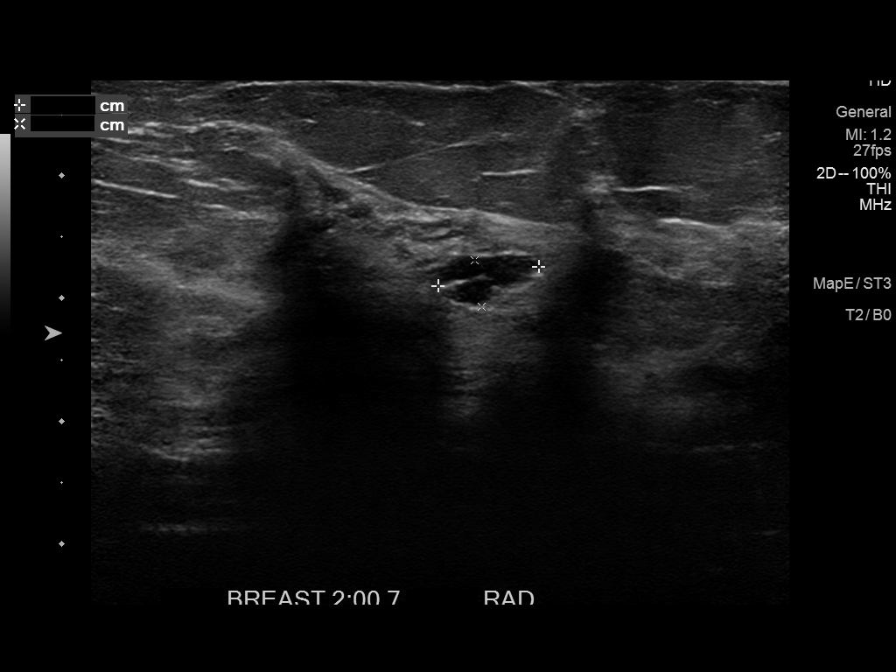
[im 3/9]
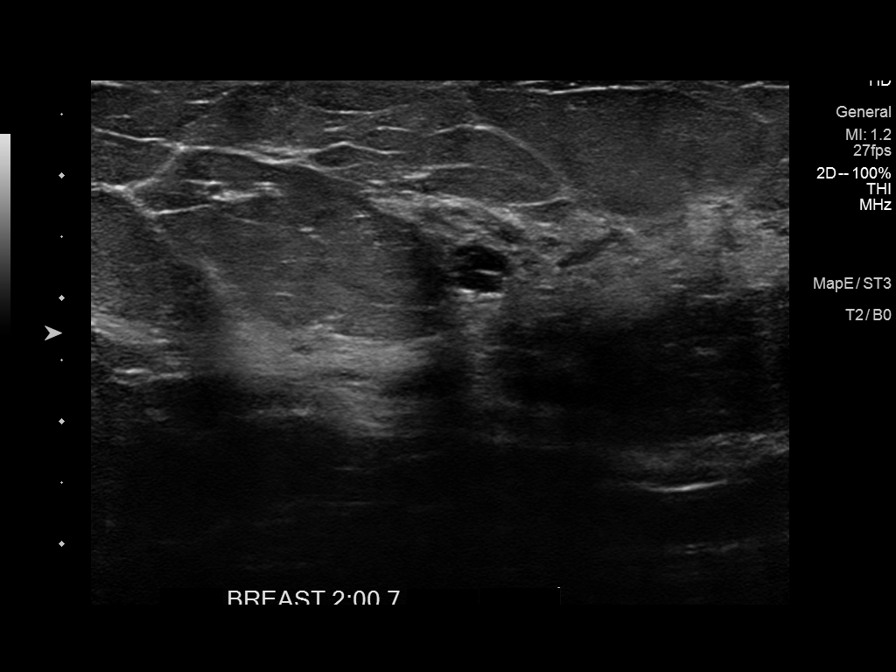
[im 4/9]
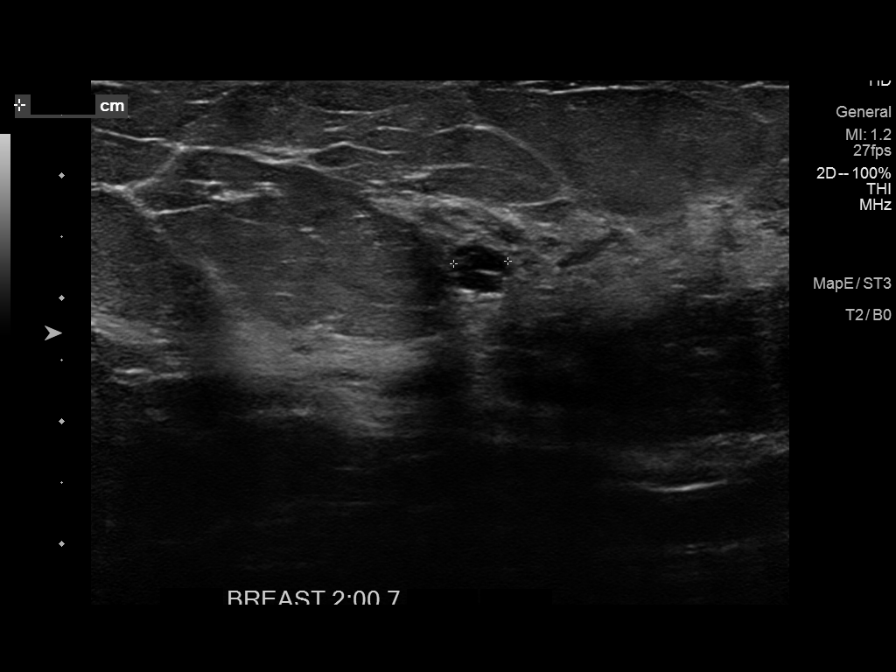
[im 5/9]
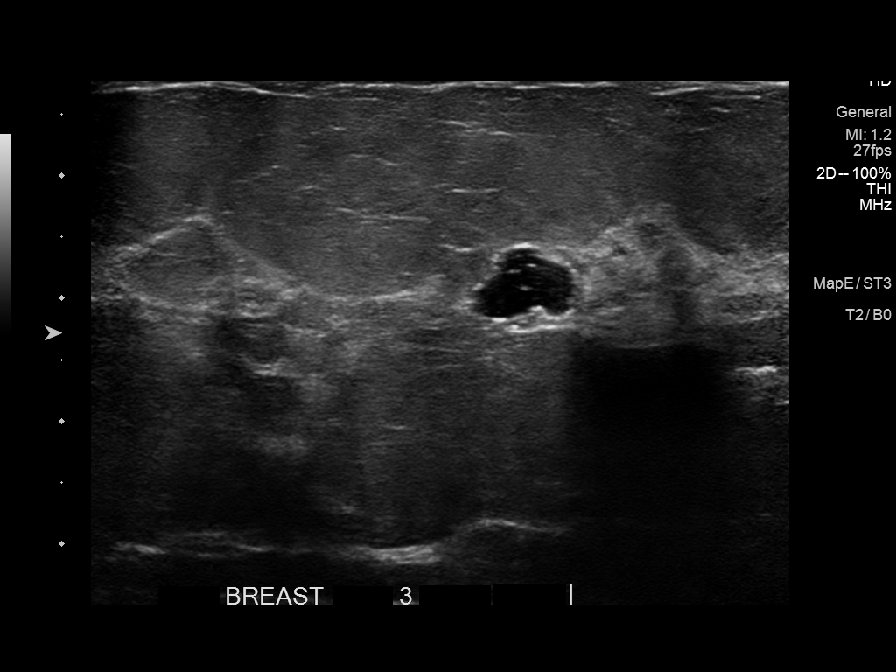
[im 6/9]
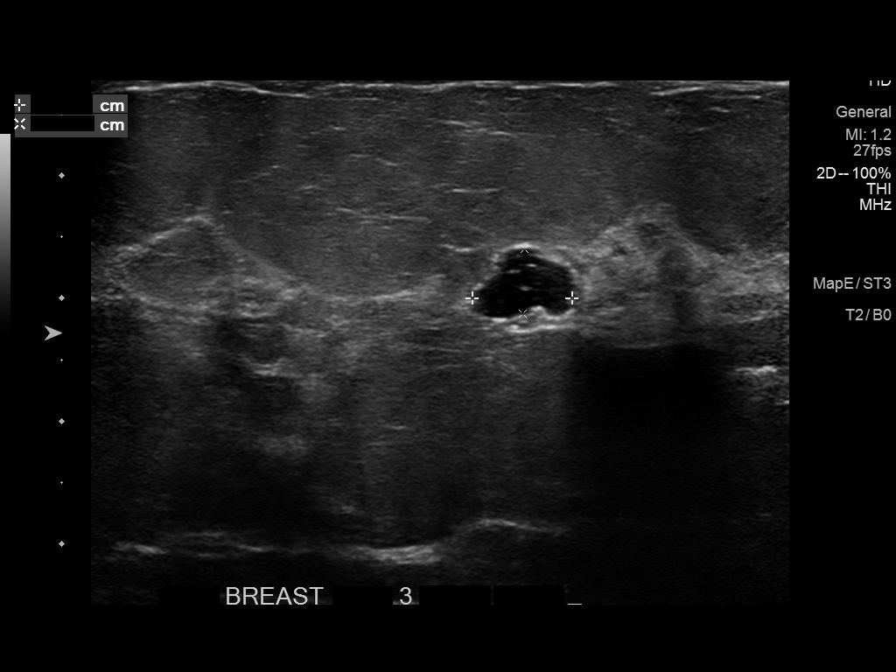
[im 7/9]
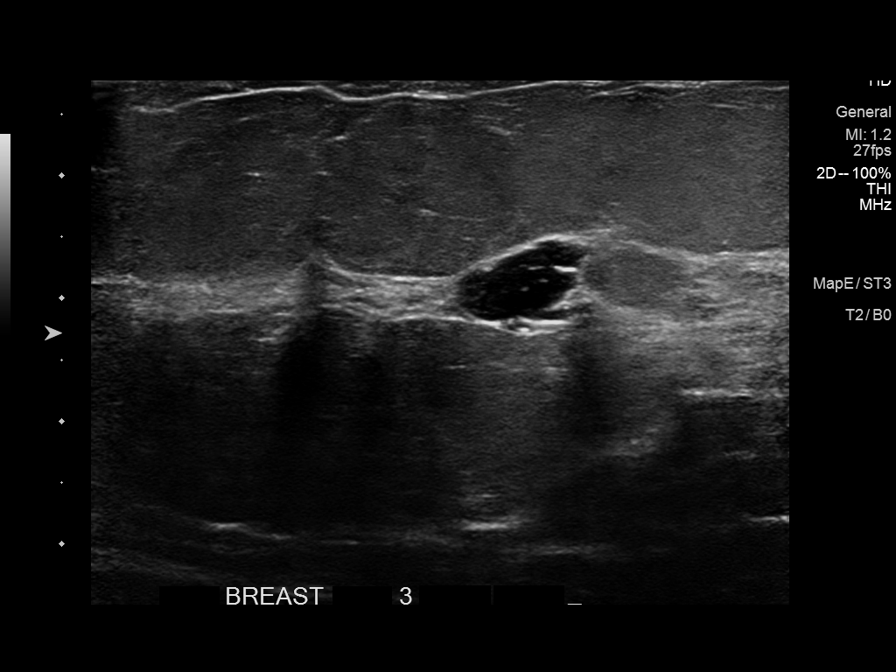
[im 8/9]
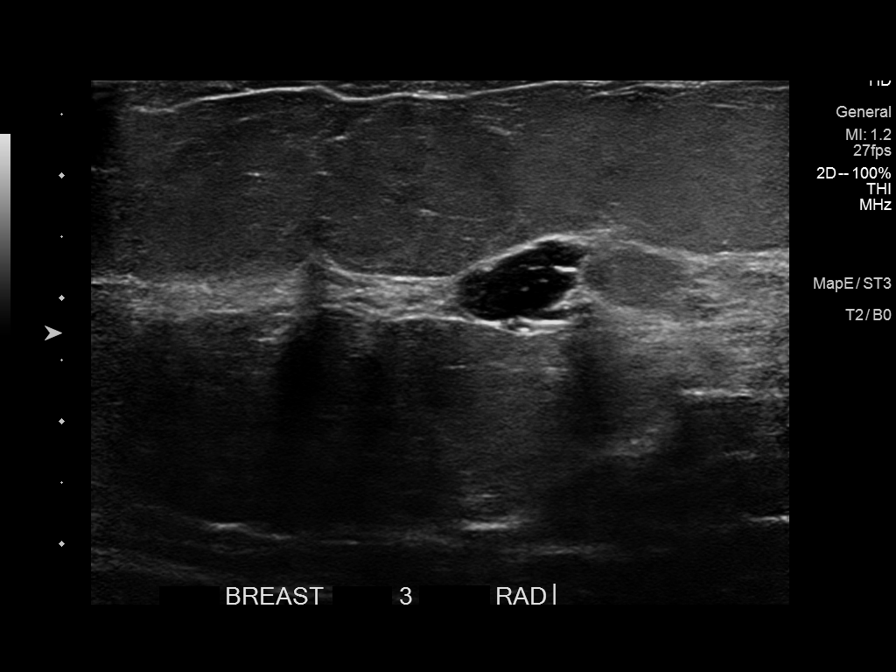
[im 9/9]
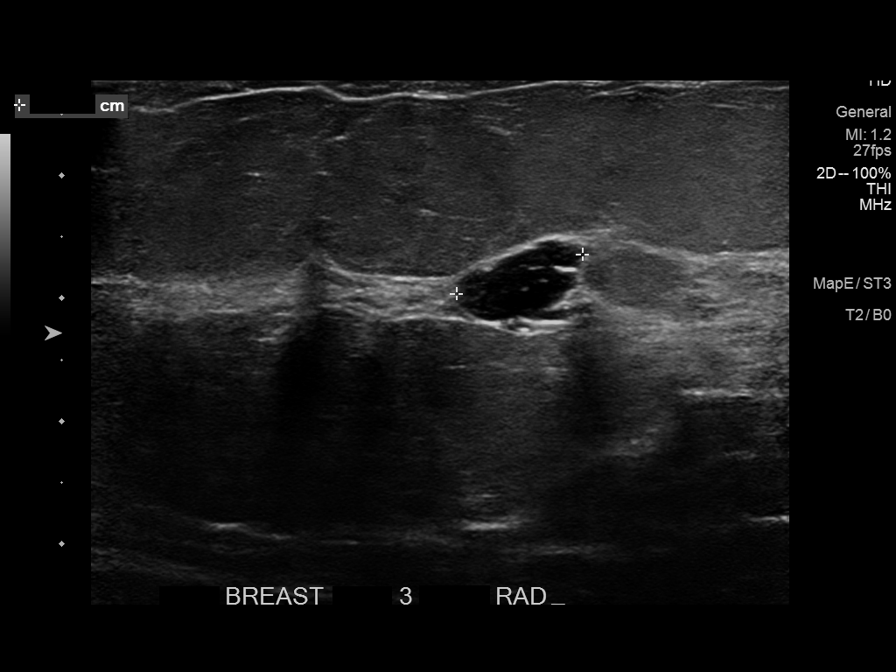

[9 of 9 positions shown; findings below may reference images not displayed]

ACR Breast Density Category c: The breast tissue is heterogeneously
dense, which may obscure small masses.
FINDINGS: The asymmetries seen in the left breast on 10/17/2017 are no longer
visualized. There are no interval findings suspicious for
malignancy.

Mammographic images were processed with CAD.

On physical exam, no mass is palpable in the 12 o'clock or 2 o'clock
position of the left breast.

Targeted ultrasound is performed, showing an 11 x 8 x 5 mm cyst
containing thin internal septations and low-level internal echoes in
the 12 o'clock position of the left breast, 3 cm from the nipple.
This is slightly more confluent than seen on 10/27/2017. There is
also an 8 x 4 x 4 mm similar-appearing cyst in the 2 o'clock
position of the left breast, 7 cm from the nipple, smaller than seen
on 10/27/2017.
IMPRESSION: Benign left breast cysts and resolved left breast asymmetries. No
evidence of malignancy.

RECOMMENDATION:
Bilateral screening mammogram in 5 months when due. That will be 1
year since mammographic evaluation of the right breast.

I have discussed the findings and recommendations with the patient.
Results were also provided in writing at the conclusion of the
visit. If applicable, a reminder letter will be sent to the patient
regarding the next appointment.

BI-RADS CATEGORY  2: Benign.

## 2019-08-15 ENCOUNTER — Encounter: Payer: Self-pay | Admitting: Physician Assistant

## 2019-09-04 ENCOUNTER — Encounter: Payer: Self-pay | Admitting: Physician Assistant

## 2019-09-04 DIAGNOSIS — Z79899 Other long term (current) drug therapy: Secondary | ICD-10-CM

## 2019-09-04 MED ORDER — MELOXICAM 15 MG PO TABS: 15.0000 mg | ORAL_TABLET | Freq: Every day | ORAL | 1 refills | Status: DC

## 2019-09-04 NOTE — Addendum Note (Signed)
Addended by: Mar Daring on: 09/04/2019 01:31 PM   Modules accepted: Orders

## 2019-09-05 ENCOUNTER — Encounter: Payer: Self-pay | Admitting: Physician Assistant

## 2019-09-05 DIAGNOSIS — R5383 Other fatigue: Secondary | ICD-10-CM

## 2019-09-07 LAB — COMPREHENSIVE METABOLIC PANEL
ALT: 29 IU/L (ref 0–32)
AST: 21 IU/L (ref 0–40)
Albumin/Globulin Ratio: 1.6 (ref 1.2–2.2)
Albumin: 4.3 g/dL (ref 3.8–4.8)
Alkaline Phosphatase: 95 IU/L (ref 39–117)
BUN/Creatinine Ratio: 14 (ref 9–23)
BUN: 14 mg/dL (ref 6–24)
Bilirubin Total: 0.4 mg/dL (ref 0.0–1.2)
CO2: 22 mmol/L (ref 20–29)
Calcium: 9.5 mg/dL (ref 8.7–10.2)
Chloride: 101 mmol/L (ref 96–106)
Creatinine, Ser: 1 mg/dL (ref 0.57–1.00)
GFR calc Af Amer: 78 mL/min/{1.73_m2} (ref >59)
GFR calc non Af Amer: 67 mL/min/{1.73_m2} (ref >59)
Globulin, Total: 2.7 g/dL (ref 1.5–4.5)
Glucose: 92 mg/dL (ref 65–99)
Potassium: 3.9 mmol/L (ref 3.5–5.2)
Sodium: 140 mmol/L (ref 134–144)
Total Protein: 7 g/dL (ref 6.0–8.5)

## 2019-09-07 LAB — TSH: TSH: 1.56 u[IU]/mL (ref 0.450–4.500)

## 2019-10-17 ENCOUNTER — Other Ambulatory Visit: Payer: Self-pay | Admitting: Physician Assistant

## 2019-10-17 DIAGNOSIS — I10 Essential (primary) hypertension: Secondary | ICD-10-CM

## 2019-12-11 ENCOUNTER — Other Ambulatory Visit: Payer: Self-pay | Admitting: Obstetrics and Gynecology

## 2019-12-11 DIAGNOSIS — Z1231 Encounter for screening mammogram for malignant neoplasm of breast: Secondary | ICD-10-CM

## 2019-12-17 ENCOUNTER — Ambulatory Visit
Admission: RE | Admit: 2019-12-17 | Discharge: 2019-12-17 | Disposition: A | Discharge status: Home / Self Care | Payer: BC Managed Care – PPO | Source: Ambulatory Visit | Attending: Obstetrics and Gynecology | Admitting: Obstetrics and Gynecology

## 2019-12-17 DIAGNOSIS — Z1231 Encounter for screening mammogram for malignant neoplasm of breast: Secondary | ICD-10-CM | POA: Diagnosis not present

## 2019-12-28 ENCOUNTER — Encounter: Payer: Self-pay | Admitting: Physician Assistant

## 2019-12-28 DIAGNOSIS — I10 Essential (primary) hypertension: Secondary | ICD-10-CM

## 2019-12-28 MED ORDER — METOPROLOL SUCCINATE ER 50 MG PO TB24: ORAL_TABLET | ORAL | 1 refills | Status: DC

## 2019-12-31 ENCOUNTER — Encounter: Payer: Self-pay | Admitting: Physician Assistant

## 2019-12-31 DIAGNOSIS — J0140 Acute pansinusitis, unspecified: Secondary | ICD-10-CM

## 2020-01-01 MED ORDER — AMOXICILLIN-POT CLAVULANATE 875-125 MG PO TABS: 1.0000 | ORAL_TABLET | Freq: Two times a day (BID) | ORAL | 0 refills | Status: DC

## 2020-01-01 NOTE — Addendum Note (Signed)
Addended by: Mar Daring on: 01/01/2020 11:46 AM   Modules accepted: Orders

## 2020-01-03 ENCOUNTER — Encounter: Payer: Self-pay | Admitting: Physician Assistant

## 2020-01-13 ENCOUNTER — Other Ambulatory Visit: Payer: Self-pay | Admitting: Physician Assistant

## 2020-01-13 DIAGNOSIS — I10 Essential (primary) hypertension: Secondary | ICD-10-CM

## 2020-01-14 MED ORDER — LOSARTAN POTASSIUM 50 MG PO TABS: 50.0000 mg | ORAL_TABLET | Freq: Every day | ORAL | 0 refills | Status: DC

## 2020-01-14 MED ORDER — HYDROCHLOROTHIAZIDE 12.5 MG PO CAPS: 12.5000 mg | ORAL_CAPSULE | Freq: Every day | ORAL | 0 refills | Status: DC

## 2020-02-13 ENCOUNTER — Encounter: Payer: Self-pay | Admitting: Physician Assistant

## 2020-02-13 DIAGNOSIS — K219 Gastro-esophageal reflux disease without esophagitis: Secondary | ICD-10-CM

## 2020-02-14 MED ORDER — OMEPRAZOLE 20 MG PO CPDR: 20.0000 mg | DELAYED_RELEASE_CAPSULE | Freq: Every day | ORAL | 1 refills | Status: DC

## 2020-03-20 ENCOUNTER — Encounter: Payer: Self-pay | Admitting: Physician Assistant

## 2020-04-13 ENCOUNTER — Encounter: Payer: Self-pay | Admitting: Physician Assistant

## 2020-04-13 DIAGNOSIS — I10 Essential (primary) hypertension: Secondary | ICD-10-CM

## 2020-04-14 MED ORDER — HYDROCHLOROTHIAZIDE 12.5 MG PO CAPS: 12.5000 mg | ORAL_CAPSULE | Freq: Every day | ORAL | 0 refills | Status: DC

## 2020-04-14 MED ORDER — LOSARTAN POTASSIUM 50 MG PO TABS: 50.0000 mg | ORAL_TABLET | Freq: Every day | ORAL | 0 refills | Status: DC

## 2020-04-18 ENCOUNTER — Encounter: Payer: Self-pay | Admitting: Physician Assistant

## 2020-04-18 DIAGNOSIS — J302 Other seasonal allergic rhinitis: Secondary | ICD-10-CM

## 2020-04-18 MED ORDER — CETIRIZINE HCL 10 MG PO TABS: 10.0000 mg | ORAL_TABLET | Freq: Every day | ORAL | 1 refills | Status: DC

## 2020-05-07 ENCOUNTER — Encounter: Payer: Self-pay | Admitting: Physician Assistant

## 2020-05-08 MED ORDER — MELOXICAM 15 MG PO TABS: 15.0000 mg | ORAL_TABLET | Freq: Every day | ORAL | 1 refills | Status: DC

## 2020-06-04 ENCOUNTER — Encounter: Payer: Self-pay | Admitting: Physician Assistant

## 2020-06-19 ENCOUNTER — Encounter: Payer: Self-pay | Admitting: Physician Assistant

## 2020-06-19 DIAGNOSIS — K219 Gastro-esophageal reflux disease without esophagitis: Secondary | ICD-10-CM

## 2020-06-20 MED ORDER — OMEPRAZOLE 20 MG PO CPDR: 20.0000 mg | DELAYED_RELEASE_CAPSULE | Freq: Every day | ORAL | 0 refills | Status: DC

## 2020-06-27 ENCOUNTER — Encounter: Payer: Self-pay | Admitting: Physician Assistant

## 2020-06-27 DIAGNOSIS — I10 Essential (primary) hypertension: Secondary | ICD-10-CM

## 2020-06-27 MED ORDER — METOPROLOL SUCCINATE ER 50 MG PO TB24: ORAL_TABLET | ORAL | 1 refills | Status: DC

## 2020-07-11 ENCOUNTER — Other Ambulatory Visit: Payer: Self-pay | Admitting: Physician Assistant

## 2020-07-11 DIAGNOSIS — I10 Essential (primary) hypertension: Secondary | ICD-10-CM

## 2020-07-11 MED ORDER — HYDROCHLOROTHIAZIDE 12.5 MG PO CAPS: 12.5000 mg | ORAL_CAPSULE | Freq: Every day | ORAL | 0 refills | Status: DC

## 2020-07-11 MED ORDER — LOSARTAN POTASSIUM 50 MG PO TABS: 50.0000 mg | ORAL_TABLET | Freq: Every day | ORAL | 0 refills | Status: DC

## 2020-08-22 ENCOUNTER — Encounter: Payer: Self-pay | Admitting: Physician Assistant

## 2020-08-22 DIAGNOSIS — J0140 Acute pansinusitis, unspecified: Secondary | ICD-10-CM

## 2020-08-25 MED ORDER — AMOXICILLIN-POT CLAVULANATE 875-125 MG PO TABS: 1.0000 | ORAL_TABLET | Freq: Two times a day (BID) | ORAL | 0 refills | Status: DC

## 2020-08-25 NOTE — Addendum Note (Signed)
Addended by: Mar Daring on: 08/25/2020 08:15 AM   Modules accepted: Orders

## 2020-10-12 ENCOUNTER — Other Ambulatory Visit: Payer: Self-pay | Admitting: Physician Assistant

## 2020-10-12 DIAGNOSIS — I10 Essential (primary) hypertension: Secondary | ICD-10-CM

## 2020-10-13 MED ORDER — LOSARTAN POTASSIUM 50 MG PO TABS: 50.0000 mg | ORAL_TABLET | Freq: Every day | ORAL | 0 refills | Status: DC

## 2020-10-13 MED ORDER — HYDROCHLOROTHIAZIDE 12.5 MG PO CAPS: 12.5000 mg | ORAL_CAPSULE | Freq: Every day | ORAL | 0 refills | Status: DC

## 2020-10-29 ENCOUNTER — Encounter: Payer: Self-pay | Admitting: Physician Assistant

## 2020-10-29 DIAGNOSIS — J302 Other seasonal allergic rhinitis: Secondary | ICD-10-CM

## 2020-10-30 MED ORDER — CETIRIZINE HCL 10 MG PO TABS: 10.0000 mg | ORAL_TABLET | Freq: Every day | ORAL | 0 refills | Status: DC

## 2020-11-04 NOTE — Progress Notes (Signed)
Established patient visit   Patient: Rachel Wells   DOB: 04-16-1971   49 y.o. Female  MRN: [id number] Visit Date: 11/05/2020  Today's healthcare provider: Mar Daring, PA-C   Chief Complaint  Patient presents with  . Follow-up   Subjective    HPI  Patient had her influenza vaccine at work back in October.  Hypertension, follow-up  BP Readings from Last 3 Encounters:  11/05/20 (!) 148/83  04/26/19 (!) 145/83  01/03/19 126/85   Wt Readings from Last 3 Encounters:  11/05/20 227 lb (103 kg)  04/26/19 220 lb (99.8 kg)  01/03/19 219 lb (99.3 kg)    Patient currently on Losartan and HCTZ  and Metoprolol 15m.  She reports excellent compliance with treatment. She is not having side effects.  She is following a Regular diet. She is exercising. She does not smoke.  Outside blood pressures are 130's upper 140's/80's. Symptoms: No chest pain No chest pressure  No palpitations No syncope  No dyspnea No orthopnea  No paroxysmal nocturnal dyspnea No lower extremity edema   Pertinent labs: Lab Results  Component Value Date   CHOL 176 06/07/2014   HDL 55 06/06/2014   LDLCALC 104 06/06/2014   TRIG 86 06/07/2014   Lab Results  Component Value Date   NA 140 09/06/2019   K 3.9 09/06/2019   CREATININE 1.00 09/06/2019   GFRNONAA 67 09/06/2019   GFRAA 78 09/06/2019   GLUCOSE 92 09/06/2019     The ASCVD Risk score (Mikey BussingDC Jr., et al., 2013) failed to calculate for the following reasons:   Cannot find a previous HDL lab   Cannot find a previous total cholesterol lab   --------------------------------------------------------------------------------------------------- Allergies: patient stable on Zyrtec 1109m GERD: stable with Omeprazole 2064mPatient Active Problem List   Diagnosis Date Noted  . Gastroesophageal reflux disease 12/03/2016  . Fatigue 04/18/2015  . Fibroid 04/18/2015  . BP (high blood pressure) 03/22/2008  . Headache, migraine 03/22/2008     Past Medical History:  Diagnosis Date  . BP (high blood pressure) 03/22/2008  . Fatigue 04/18/2015  . Fibroid 04/18/2015  . Gastroesophageal reflux disease 12/03/2016  . Headache, migraine 03/22/2008  . Hypertension        Medications: Outpatient Medications Prior to Visit  Medication Sig  . Cholecalciferol (VITAMIN D3 PO) Take 50 mg by mouth.  . ELDERBERRY PO Take by mouth daily.  . MMarland KitchenRENA, 52 MG, 20 MCG/24HR IUD   . MULTIPLE VITAMIN PO Take 1 tablet by mouth daily.  . [DISCONTINUED] cetirizine (ZYRTEC) 10 MG tablet Take 1 tablet (10 mg total) by mouth daily. Please schedule an office visit before anymore refills.  . [DISCONTINUED] hydrochlorothiazide (MICROZIDE) 12.5 MG capsule Take 1 capsule (12.5 mg total) by mouth daily.  . [DISCONTINUED] losartan (COZAAR) 50 MG tablet Take 1 tablet (50 mg total) by mouth daily.  . [DISCONTINUED] metoprolol succinate (TOPROL-XL) 50 MG 24 hr tablet TAKE 1 TABLET BY MOUTH ONCE DAILY IMMEDIATELY  FOLLOWING  A  MEAL  . [DISCONTINUED] omeprazole (PRILOSEC) 20 MG capsule Take 1 capsule (20 mg total) by mouth daily. Pt needs an office visit before anymore refills.  . meloxicam (MOBIC) 15 MG tablet Take 1 tablet (15 mg total) by mouth daily. (Patient not taking: Reported on 11/05/2020)  . [DISCONTINUED] amoxicillin-clavulanate (AUGMENTIN) 875-125 MG tablet Take 1 tablet by mouth 2 (two) times daily.  . [DISCONTINUED] benzonatate (TESSALON) 200 MG capsule Take 1 capsule (200 mg total) by mouth 2 (two)  times daily as needed for cough.  . [DISCONTINUED] predniSONE (DELTASONE) 20 MG tablet Take 1 tablet (20 mg total) by mouth daily with breakfast.  . [DISCONTINUED] tolterodine (DETROL LA) 4 MG 24 hr capsule Take by mouth.   No facility-administered medications prior to visit.    Review of Systems  Constitutional: Positive for diaphoresis.  Respiratory: Negative.   Cardiovascular: Negative.   Endocrine: Positive for heat intolerance.  Neurological:  Negative.     Last CBC Lab Results  Component Value Date   WBC 9.6 06/07/2017   HGB 14.1 06/07/2017   HCT 40.4 06/07/2017   MCV 85.2 06/07/2017   MCH 29.8 06/07/2017   RDW 13.9 06/07/2017   PLT 293 92/42/6834   Last metabolic panel Lab Results  Component Value Date   GLUCOSE 92 09/06/2019   NA 140 09/06/2019   K 3.9 09/06/2019   CL 101 09/06/2019   CO2 22 09/06/2019   BUN 14 09/06/2019   CREATININE 1.00 09/06/2019   GFRNONAA 67 09/06/2019   GFRAA 78 09/06/2019   CALCIUM 9.5 09/06/2019   PROT 7.0 09/06/2019   ALBUMIN 4.3 09/06/2019   LABGLOB 2.7 09/06/2019   AGRATIO 1.6 09/06/2019   BILITOT 0.4 09/06/2019   ALKPHOS 95 09/06/2019   AST 21 09/06/2019   ALT 29 09/06/2019   ANIONGAP 9 06/07/2017      Objective    BP (!) 148/83 (BP Location: Left Arm, Patient Position: Sitting, Cuff Size: Large)   Pulse 80   Temp 98.2 F (36.8 C) (Oral)   Resp 16   Ht 5' 3"  (1.6 m)   Wt 227 lb (103 kg)   BMI 40.21 kg/m  BP Readings from Last 3 Encounters:  11/05/20 (!) 148/83  04/26/19 (!) 145/83  01/03/19 126/85   Wt Readings from Last 3 Encounters:  11/05/20 227 lb (103 kg)  04/26/19 220 lb (99.8 kg)  01/03/19 219 lb (99.3 kg)      Physical Exam Vitals reviewed.  Constitutional:      General: She is not in acute distress.    Appearance: Normal appearance. She is well-developed and well-groomed. She is obese. She is not ill-appearing or diaphoretic.  HENT:     Head: Normocephalic and atraumatic.  Cardiovascular:     Rate and Rhythm: Normal rate and regular rhythm.     Pulses: Normal pulses.     Heart sounds: Normal heart sounds. No murmur heard.  No friction rub. No gallop.   Pulmonary:     Effort: Pulmonary effort is normal. No respiratory distress.     Breath sounds: Normal breath sounds. No wheezing or rales.  Musculoskeletal:     Cervical back: Normal range of motion and neck supple.     Right lower leg: No edema.     Left lower leg: No edema.    Neurological:     Mental Status: She is alert.  Psychiatric:        Mood and Affect: Mood normal.        Behavior: Behavior is cooperative.        Thought Content: Thought content normal.       No results found for any visits on 11/05/20.  Assessment & Plan     1. Class 3 severe obesity due to excess calories with serious comorbidity and body mass index (BMI) of 40.0 to 44.9 in adult [facility]) Counseled patient on healthy lifestyle modifications including dieting and exercise.  Will check labs as below and f/u pending results. -  CBC w/Diff/Platelet - Comprehensive Metabolic Panel (CMET) - TSH - Lipid Panel With LDL/HDL Ratio - HgB A1c  2. Seasonal allergies Stable. Diagnosis pulled for medication refill. Continue current medical treatment plan. - cetirizine (ZYRTEC) 10 MG tablet; Take 1 tablet (10 mg total) by mouth daily.  Dispense: 90 tablet; Refill: 3  3. Essential hypertension Stable. Diagnosis pulled for medication refill. Continue current medical treatment plan. Will check labs as below and f/u pending results. - hydrochlorothiazide (MICROZIDE) 12.5 MG capsule; Take 1 capsule (12.5 mg total) by mouth daily.  Dispense: 90 capsule; Refill: 3 - losartan (COZAAR) 50 MG tablet; Take 1 tablet (50 mg total) by mouth daily.  Dispense: 90 tablet; Refill: 3 - metoprolol succinate (TOPROL-XL) 50 MG 24 hr tablet; TAKE 1 TABLET BY MOUTH ONCE DAILY IMMEDIATELY  FOLLOWING  A  MEAL  Dispense: 90 tablet; Refill: 3 - CBC w/Diff/Platelet - Comprehensive Metabolic Panel (CMET) - Lipid Panel With LDL/HDL Ratio - HgB A1c  4. Gastroesophageal reflux disease without esophagitis Stable. Diagnosis pulled for medication refill. Continue current medical treatment plan. Will check labs as below and f/u pending results. - omeprazole (PRILOSEC) 20 MG capsule; Take 1 capsule (20 mg total) by mouth daily.  Dispense: 90 capsule; Refill: 3 - CBC w/Diff/Platelet - Comprehensive Metabolic Panel (CMET)  5.  Hot flashes New over last year and worsening. Will check labs as below. Discussed trying Black cohosh OTC. F/U in 1-3 months if needed.  - FSH/LH - Estrogens, total  6. Encounter for hepatitis C screening test for low risk patient Will check labs as below and f/u pending results. - Hepatitis C Antibody  7. Screening for HIV without presence of risk factors Will check labs as below and f/u pending results. - HIV antibody (with reflex)  8. Encounter for breast cancer screening using non-mammogram modality There is family history of breast cancer. She does perform regular self breast exams. Mammogram was ordered as below. Information for [facility] clinic was given to patient so she may schedule her mammogram at her convenience. - MM 3D SCREEN BREAST BILATERAL; Future  9. Family history of breast cancer in mother Needs annual mammograms. - MM 3D SCREEN BREAST BILATERAL; Future   Return in about 3 months (around 02/05/2021).      Reynolds Bowl, PA-C, have reviewed all documentation for this visit. The documentation on 11/05/20 for the exam, diagnosis, procedures, and orders are all accurate and complete.   Rubye Beach  [facility] [phone] (phone) [phone] (fax)  [place]

## 2020-11-05 ENCOUNTER — Ambulatory Visit: Payer: BC Managed Care – PPO | Admitting: Physician Assistant

## 2020-11-05 ENCOUNTER — Other Ambulatory Visit: Payer: Self-pay

## 2020-11-05 ENCOUNTER — Encounter: Payer: Self-pay | Admitting: Physician Assistant

## 2020-11-05 VITALS — BP 148/83 | HR 80 | Temp 98.2°F | Resp 16 | Ht 63.0 in | Wt 227.0 lb

## 2020-11-05 DIAGNOSIS — Z803 Family history of malignant neoplasm of breast: Secondary | ICD-10-CM

## 2020-11-05 DIAGNOSIS — Z6841 Body Mass Index (BMI) 40.0 and over, adult: Secondary | ICD-10-CM

## 2020-11-05 DIAGNOSIS — Z114 Encounter for screening for human immunodeficiency virus [HIV]: Secondary | ICD-10-CM

## 2020-11-05 DIAGNOSIS — K219 Gastro-esophageal reflux disease without esophagitis: Secondary | ICD-10-CM

## 2020-11-05 DIAGNOSIS — I10 Essential (primary) hypertension: Secondary | ICD-10-CM

## 2020-11-05 DIAGNOSIS — R232 Flushing: Secondary | ICD-10-CM

## 2020-11-05 DIAGNOSIS — J302 Other seasonal allergic rhinitis: Secondary | ICD-10-CM

## 2020-11-05 DIAGNOSIS — Z1239 Encounter for other screening for malignant neoplasm of breast: Secondary | ICD-10-CM

## 2020-11-05 DIAGNOSIS — Z1159 Encounter for screening for other viral diseases: Secondary | ICD-10-CM

## 2020-11-05 MED ORDER — LOSARTAN POTASSIUM 50 MG PO TABS: 50.0000 mg | ORAL_TABLET | Freq: Every day | ORAL | 3 refills | Status: DC

## 2020-11-05 MED ORDER — METOPROLOL SUCCINATE ER 50 MG PO TB24: ORAL_TABLET | ORAL | 3 refills | Status: DC

## 2020-11-05 MED ORDER — HYDROCHLOROTHIAZIDE 12.5 MG PO CAPS: 12.5000 mg | ORAL_CAPSULE | Freq: Every day | ORAL | 3 refills | Status: DC

## 2020-11-05 MED ORDER — OMEPRAZOLE 20 MG PO CPDR: 20.0000 mg | DELAYED_RELEASE_CAPSULE | Freq: Every day | ORAL | 3 refills | Status: DC

## 2020-11-05 MED ORDER — CETIRIZINE HCL 10 MG PO TABS: 10.0000 mg | ORAL_TABLET | Freq: Every day | ORAL | 3 refills | Status: DC

## 2020-11-05 NOTE — Patient Instructions (Addendum)
Black Cohosh or Research scientist (medical) for hot flashes  [facility] at [facility] [place],  [place]  [address] [address]: [phone]   DASH Eating Plan DASH stands for "Dietary Approaches to Stop Hypertension." The DASH eating plan is a healthy eating plan that has been shown to reduce high blood pressure (hypertension). It may also reduce your risk for type 2 diabetes, heart disease, and stroke. The DASH eating plan may also help with weight loss. What are tips for following this plan?  General guidelines  Avoid eating more than 2,300 mg (milligrams) of salt (sodium) a day. If you have hypertension, you may need to reduce your sodium intake to 1,500 mg a day.  Limit alcohol intake to no more than 1 drink a day for nonpregnant women and 2 drinks a day for men. One drink equals 12 oz of beer, 5 oz of wine, or 1 oz of hard liquor.  Work with your health care provider to maintain a healthy body weight or to lose weight. Ask what an ideal weight is for you.  Get at least 30 minutes of exercise that causes your heart to beat faster (aerobic exercise) most days of the week. Activities may include walking, swimming, or biking.  Work with your health care provider or diet and nutrition specialist (dietitian) to adjust your eating plan to your individual calorie needs. Reading food labels   Check food labels for the amount of sodium per serving. Choose foods with less than 5 percent of the Daily Value of sodium. Generally, foods with less than 300 mg of sodium per serving fit into this eating plan.  To find whole grains, look for the word "whole" as the first word in the ingredient list. Shopping  Buy products labeled as "low-sodium" or "no salt added."  Buy fresh foods. Avoid canned foods and premade or frozen meals. Cooking  Avoid adding salt when cooking. Use salt-free seasonings or herbs instead of table salt or sea salt. Check with your health care provider or  pharmacist before using salt substitutes.  Do not fry foods. Cook foods using healthy methods such as baking, boiling, grilling, and broiling instead.  Cook with heart-healthy oils, such as olive, canola, soybean, or sunflower oil. Meal planning  Eat a balanced diet that includes: ? 5 or more servings of fruits and vegetables each day. At each meal, try to fill half of your plate with fruits and vegetables. ? Up to 6-8 servings of whole grains each day. ? Less than 6 oz of lean meat, poultry, or fish each day. A 3-oz serving of meat is about the same size as a deck of cards. One egg equals 1 oz. ? 2 servings of low-fat dairy each day. ? A serving of nuts, seeds, or beans 5 times each week. ? Heart-healthy fats. Healthy fats called Omega-3 fatty acids are found in foods such as flaxseeds and coldwater fish, like sardines, salmon, and mackerel.  Limit how much you eat of the following: ? Canned or prepackaged foods. ? Food that is high in trans fat, such as fried foods. ? Food that is high in saturated fat, such as fatty meat. ? Sweets, desserts, sugary drinks, and other foods with added sugar. ? Full-fat dairy products.  Do not salt foods before eating.  Try to eat at least 2 vegetarian meals each week.  Eat more home-cooked food and less restaurant, buffet, and fast food.  When eating at a restaurant, ask that your food  be prepared with less salt or no salt, if possible. What foods are recommended? The items listed may not be a complete list. Talk with your dietitian about what dietary choices are best for you. Grains Whole-grain or whole-wheat bread. Whole-grain or whole-wheat pasta. Brown rice. [place] Morrow. Bulgur. Whole-grain and low-sodium cereals. Pita bread. Low-fat, low-sodium crackers. Whole-wheat flour tortillas. Vegetables Fresh or frozen vegetables (raw, steamed, roasted, or grilled). Low-sodium or reduced-sodium tomato and vegetable juice. Low-sodium or  reduced-sodium tomato sauce and tomato paste. Low-sodium or reduced-sodium canned vegetables. Fruits All fresh, dried, or frozen fruit. Canned fruit in natural juice (without added sugar). Meat and other protein foods Skinless chicken or [place]. Ground chicken or [place]. Pork with fat trimmed off. Fish and seafood. Egg whites. Dried beans, peas, or lentils. Unsalted nuts, nut butters, and seeds. Unsalted canned beans. Lean cuts of beef with fat trimmed off. Low-sodium, lean deli meat. Dairy Low-fat (1%) or fat-free (skim) milk. Fat-free, low-fat, or reduced-fat cheeses. Nonfat, low-sodium ricotta or cottage cheese. Low-fat or nonfat yogurt. Low-fat, low-sodium cheese. Fats and oils Soft margarine without trans fats. Vegetable oil. Low-fat, reduced-fat, or light mayonnaise and salad dressings (reduced-sodium). Canola, safflower, olive, soybean, and sunflower oils. Avocado. Seasoning and other foods Herbs. Spices. Seasoning mixes without salt. Unsalted popcorn and pretzels. Fat-free sweets. What foods are not recommended? The items listed may not be a complete list. Talk with your dietitian about what dietary choices are best for you. Grains Baked goods made with fat, such as croissants, muffins, or some breads. Dry pasta or rice meal packs. Vegetables Creamed or fried vegetables. Vegetables in a cheese sauce. Regular canned vegetables (not low-sodium or reduced-sodium). Regular canned tomato sauce and paste (not low-sodium or reduced-sodium). Regular tomato and vegetable juice (not low-sodium or reduced-sodium). Angie Fava. Olives. Fruits Canned fruit in a light or heavy syrup. Fried fruit. Fruit in cream or butter sauce. Meat and other protein foods Fatty cuts of meat. Ribs. Fried meat. Berniece Salines. Sausage. Bologna and other processed lunch meats. Salami. Fatback. Hotdogs. Bratwurst. Salted nuts and seeds. Canned beans with added salt. Canned or smoked fish. Whole eggs or egg yolks. Chicken or [place]  with skin. Dairy Whole or 2% milk, cream, and half-and-half. Whole or full-fat cream cheese. Whole-fat or sweetened yogurt. Full-fat cheese. Nondairy creamers. Whipped toppings. Processed cheese and cheese spreads. Fats and oils Butter. Stick margarine. Lard. Shortening. Ghee. Bacon fat. Tropical oils, such as coconut, palm kernel, or palm oil. Seasoning and other foods Salted popcorn and pretzels. Onion salt, garlic salt, seasoned salt, table salt, and sea salt. Worcestershire sauce. Tartar sauce. Barbecue sauce. Teriyaki sauce. Soy sauce, including reduced-sodium. Steak sauce. Canned and packaged gravies. Fish sauce. Oyster sauce. Cocktail sauce. Horseradish that you find on the shelf. Ketchup. Mustard. Meat flavorings and tenderizers. Bouillon cubes. Hot sauce and Tabasco sauce. Premade or packaged marinades. Premade or packaged taco seasonings. Relishes. Regular salad dressings. Where to find more information:  National Heart, Lung, and [place]: https://wilson-eaton.com/  American Heart Association: www.heart.org Summary  The DASH eating plan is a healthy eating plan that has been shown to reduce high blood pressure (hypertension). It may also reduce your risk for type 2 diabetes, heart disease, and stroke.  With the DASH eating plan, you should limit salt (sodium) intake to 2,300 mg a day. If you have hypertension, you may need to reduce your sodium intake to 1,500 mg a day.  When on the DASH eating plan, aim to eat more fresh fruits and vegetables, whole  grains, lean proteins, low-fat dairy, and heart-healthy fats.  Work with your health care provider or diet and nutrition specialist (dietitian) to adjust your eating plan to your individual calorie needs. This information is not intended to replace advice given to you by your health care provider. Make sure you discuss any questions you have with your health care provider. Document Revised: 11/11/2017 Document Reviewed:  11/22/2016 Elsevier Patient Education  2020 [employer].

## 2020-11-08 LAB — CBC WITH DIFFERENTIAL/PLATELET
Basophils Absolute: 0 10*3/uL (ref 0.0–0.2)
Basos: 1 %
EOS (ABSOLUTE): 0.2 10*3/uL (ref 0.0–0.4)
Eos: 2 %
Hematocrit: 38.8 % (ref 34.0–46.6)
Hemoglobin: 13.2 g/dL (ref 11.1–15.9)
Immature Grans (Abs): 0 10*3/uL (ref 0.0–0.1)
Immature Granulocytes: 0 %
Lymphocytes Absolute: 2.2 10*3/uL (ref 0.7–3.1)
Lymphs: 30 %
MCH: 29 pg (ref 26.6–33.0)
MCHC: 34 g/dL (ref 31.5–35.7)
MCV: 85 fL (ref 79–97)
Monocytes Absolute: 0.6 10*3/uL (ref 0.1–0.9)
Monocytes: 9 %
Neutrophils Absolute: 4.3 10*3/uL (ref 1.4–7.0)
Neutrophils: 58 %
Platelets: 328 10*3/uL (ref 150–450)
RBC: 4.55 x10E6/uL (ref 3.77–5.28)
RDW: 12.9 % (ref 11.7–15.4)
WBC: 7.4 10*3/uL (ref 3.4–10.8)

## 2020-11-08 LAB — COMPREHENSIVE METABOLIC PANEL
ALT: 23 IU/L (ref 0–32)
AST: 13 IU/L (ref 0–40)
Albumin/Globulin Ratio: 1.8 (ref 1.2–2.2)
Albumin: 4.3 g/dL (ref 3.8–4.8)
Alkaline Phosphatase: 94 IU/L (ref 44–121)
BUN/Creatinine Ratio: 13 (ref 9–23)
BUN: 12 mg/dL (ref 6–24)
Bilirubin Total: 0.4 mg/dL (ref 0.0–1.2)
CO2: 26 mmol/L (ref 20–29)
Calcium: 9.5 mg/dL (ref 8.7–10.2)
Chloride: 102 mmol/L (ref 96–106)
Creatinine, Ser: 0.9 mg/dL (ref 0.57–1.00)
GFR calc Af Amer: 87 mL/min/{1.73_m2} (ref >59)
GFR calc non Af Amer: 76 mL/min/{1.73_m2} (ref >59)
Globulin, Total: 2.4 g/dL (ref 1.5–4.5)
Glucose: 102 mg/dL — ABNORMAL HIGH (ref 65–99)
Potassium: 3.3 mmol/L — ABNORMAL LOW (ref 3.5–5.2)
Sodium: 140 mmol/L (ref 134–144)
Total Protein: 6.7 g/dL (ref 6.0–8.5)

## 2020-11-08 LAB — LIPID PANEL WITH LDL/HDL RATIO
Cholesterol, Total: 161 mg/dL (ref 100–199)
HDL: 39 mg/dL — ABNORMAL LOW (ref >39)
LDL Chol Calc (NIH): 101 mg/dL — ABNORMAL HIGH (ref 0–99)
LDL/HDL Ratio: 2.6 ratio (ref 0.0–3.2)
Triglycerides: 115 mg/dL (ref 0–149)
VLDL Cholesterol Cal: 21 mg/dL (ref 5–40)

## 2020-11-08 LAB — HEMOGLOBIN A1C
Est. average glucose Bld gHb Est-mCnc: 117 mg/dL
Hgb A1c MFr Bld: 5.7 % — ABNORMAL HIGH (ref 4.8–5.6)

## 2020-11-08 LAB — TSH: TSH: 1.32 u[IU]/mL (ref 0.450–4.500)

## 2020-11-08 LAB — FSH/LH
FSH: 2.4 m[IU]/mL
LH: 2.2 m[IU]/mL

## 2020-11-08 LAB — ESTROGENS, TOTAL: Estrogen: 161 pg/mL

## 2020-11-08 LAB — HEPATITIS C ANTIBODY: Hep C Virus Ab: <0.1 s/co ratio (ref 0.0–0.9)

## 2020-11-08 LAB — HIV ANTIBODY (ROUTINE TESTING W REFLEX): HIV Screen 4th Generation wRfx: NONREACTIVE

## 2020-11-11 ENCOUNTER — Telehealth: Payer: Self-pay

## 2020-11-11 DIAGNOSIS — E876 Hypokalemia: Secondary | ICD-10-CM

## 2020-11-11 DIAGNOSIS — Z91013 Allergy to seafood: Secondary | ICD-10-CM

## 2020-11-11 MED ORDER — EPINEPHRINE 0.3 MG/0.3ML IJ SOAJ: 0.3000 mg | INTRAMUSCULAR | 3 refills | Status: DC | PRN

## 2020-11-11 NOTE — Telephone Encounter (Signed)
[id number] for [facility] nurse to give patient message

## 2020-11-11 NOTE — Telephone Encounter (Signed)
-----   Message from Mar Daring, [place] sent at 11/11/2020  9:10 AM EST ----- Blood count is normal. Kidney and liver function are normal. Sodium and calcium are normal. Potassium is slightly low. Can increase potassium rich foods like sweet potatoes, leafy greens (kale, spinach, etc), broccoli and bananas. We can recheck in 3-4 weeks. Thyroid is normal. Cholesterol is normal. A1c is borderline elevated at 5.7. Estrogen, FSH and  LH show you are most likely you are still ovulating, not in menopause. HIV screen is negative. Hepatitis C screen is negative.

## 2020-11-11 NOTE — Telephone Encounter (Signed)
Black cohosh needs to be taken daily.   Epi pen sent in

## 2020-11-11 NOTE — Telephone Encounter (Signed)
Patient returned call and was read note by [name] written 11/11/20.  She verbalized understanding of all information.

## 2020-11-11 NOTE — Telephone Encounter (Signed)
Patient advised as directed below. She asked how often does she needs to take the Cohosh? Does she take it only when she has the hot flashes, every other day or daily and for how long? She would also like a prescription for an api-pen she forgot to ask about it yesterday but her new insurance asked if she had one since she is allergic to shellfish.  [facility]: [facility]

## 2020-11-26 ENCOUNTER — Other Ambulatory Visit: Payer: Self-pay | Admitting: Physician Assistant

## 2020-11-26 DIAGNOSIS — J302 Other seasonal allergic rhinitis: Secondary | ICD-10-CM

## 2020-12-03 LAB — COMPREHENSIVE METABOLIC PANEL
ALT: 23 IU/L (ref 0–32)
AST: 17 IU/L (ref 0–40)
Albumin/Globulin Ratio: 1.7 (ref 1.2–2.2)
Albumin: 4.3 g/dL (ref 3.8–4.8)
Alkaline Phosphatase: 93 IU/L (ref 44–121)
BUN/Creatinine Ratio: 10 (ref 9–23)
BUN: 9 mg/dL (ref 6–24)
Bilirubin Total: 0.4 mg/dL (ref 0.0–1.2)
CO2: 20 mmol/L (ref 20–29)
Calcium: 9.2 mg/dL (ref 8.7–10.2)
Chloride: 100 mmol/L (ref 96–106)
Creatinine, Ser: 0.88 mg/dL (ref 0.57–1.00)
GFR calc Af Amer: 90 mL/min/{1.73_m2} (ref >59)
GFR calc non Af Amer: 78 mL/min/{1.73_m2} (ref >59)
Globulin, Total: 2.5 g/dL (ref 1.5–4.5)
Glucose: 80 mg/dL (ref 65–99)
Potassium: 3.8 mmol/L (ref 3.5–5.2)
Sodium: 139 mmol/L (ref 134–144)
Total Protein: 6.8 g/dL (ref 6.0–8.5)

## 2020-12-24 ENCOUNTER — Encounter: Payer: Self-pay | Admitting: Physician Assistant

## 2020-12-24 DIAGNOSIS — I10 Essential (primary) hypertension: Secondary | ICD-10-CM

## 2020-12-24 MED ORDER — METOPROLOL SUCCINATE ER 50 MG PO TB24: ORAL_TABLET | ORAL | 3 refills | Status: DC

## 2021-01-04 ENCOUNTER — Encounter: Payer: Self-pay | Admitting: Physician Assistant

## 2021-01-05 NOTE — Progress Notes (Unsigned)
MyChart Video Visit    Virtual Visit via Video Note   This visit type was conducted due to national recommendations for restrictions regarding the COVID-19 Pandemic (e.g. social distancing) in an effort to limit this patient's exposure and mitigate transmission in our community. This patient is at least at moderate risk for complications without adequate follow up. This format is felt to be most appropriate for this patient at this time. Physical exam was limited by quality of the video and audio technology used for the visit.  Parties involved in visit as below:   Patient location: at home  Provider location: Provider: Provider's office at  [facility], [place].     I discussed the limitations of evaluation and management by telemedicine and the availability of in person appointments. The patient expressed understanding and agreed to proceed.  Patient: Rachel Wells   DOB: 15-Jan-1971   50 y.o. Female  MRN: [id number] Visit Date: 01/06/2021  Today's healthcare provider: Marcille Buffy, FNP   Chief Complaint  Patient presents with  . URI   Subjective    URI  This is a new problem. The current episode started in the past 7 days. There has been no fever. Associated symptoms include coughing, headaches, neck pain, a plugged ear sensation, rhinorrhea, sinus pain, sneezing and a sore throat. Pertinent negatives include no abdominal pain, chest pain, congestion, diarrhea, dysuria, ear pain, joint pain, joint swelling, nausea, rash, swollen glands, vomiting or wheezing. Treatments tried: Coricidin Hbp. The treatment provided mild relief.   She has done two negative covid test.  Denies any exposures or ill contacts.  Denies any other recent illness.   Dental pain, sinus pressure. Facial pressure.   Denies any shortness of breath or chest congestion.   Patient  denies any fever, body aches,chills, rash, chest pain, shortness of breath, nausea, vomiting, or  diarrhea.   Patient Active Problem List   Diagnosis Date Noted  . Pelvic pain in female 01/06/2021  . Gastroesophageal reflux disease 12/03/2016  . Fatigue 04/18/2015  . Fibroid 04/18/2015  . BP (high blood pressure) 03/22/2008  . Headache, migraine 03/22/2008   Past Medical History:  Diagnosis Date  . BP (high blood pressure) 03/22/2008  . Fatigue 04/18/2015  . Fibroid 04/18/2015  . Gastroesophageal reflux disease 12/03/2016  . Headache, migraine 03/22/2008  . Hypertension    Allergies  Allergen Reactions  . Shrimp [Shellfish Allergy] Swelling  . Neomycin-Bacitracin Zn-Polymyx Itching and Rash    REACTION: rash, itch      Medications: Outpatient Medications Prior to Visit  Medication Sig  . cetirizine (ZYRTEC) 10 MG tablet Take 1 tablet (10 mg total) by mouth daily.  . Cholecalciferol (VITAMIN D3 PO) Take 50 mg by mouth.  . ELDERBERRY PO Take by mouth daily.  Marland Kitchen EPINEPHrine 0.3 mg/0.3 mL IJ SOAJ injection Inject 0.3 mg into the muscle as needed for anaphylaxis.  . hydrochlorothiazide (MICROZIDE) 12.5 MG capsule Take 1 capsule (12.5 mg total) by mouth daily.  Marland Kitchen losartan (COZAAR) 50 MG tablet Take 1 tablet (50 mg total) by mouth daily.  . metoprolol succinate (TOPROL-XL) 50 MG 24 hr tablet TAKE 1 TABLET BY MOUTH ONCE DAILY IMMEDIATELY  FOLLOWING  A  MEAL  . MIRENA, 52 MG, 20 MCG/24HR IUD   . MULTIPLE VITAMIN PO Take 1 tablet by mouth daily.  Marland Kitchen omeprazole (PRILOSEC) 20 MG capsule Take 1 capsule (20 mg total) by mouth daily.  . meloxicam (MOBIC) 15 MG tablet Take 1 tablet (  15 mg total) by mouth daily. (Patient not taking: No sig reported)   No facility-administered medications prior to visit.    Review of Systems  Constitutional: Positive for fatigue.  HENT: Positive for dental problem (dental pain . ), postnasal drip, rhinorrhea, sinus pressure, sinus pain, sneezing and sore throat. Negative for congestion, ear pain, nosebleeds, tinnitus and trouble swallowing.   Respiratory:  Positive for cough. Negative for apnea, choking, chest tightness, shortness of breath, wheezing and stridor.   Cardiovascular: Negative for chest pain, palpitations and leg swelling.  Gastrointestinal: Negative.  Negative for abdominal pain, diarrhea, nausea and vomiting.  Genitourinary: Negative for dysuria.  Musculoskeletal: Positive for neck pain. Negative for joint pain.  Skin: Negative for color change, pallor, rash and wound.  Neurological: Positive for headaches.    Last CBC Lab Results  Component Value Date   WBC 7.4 11/05/2020   HGB 13.2 11/05/2020   HCT 38.8 11/05/2020   MCV 85 11/05/2020   MCH 29.0 11/05/2020   RDW 12.9 11/05/2020   PLT 328 73/53/2992   Last metabolic panel Lab Results  Component Value Date   GLUCOSE 80 12/02/2020   NA 139 12/02/2020   K 3.8 12/02/2020   CL 100 12/02/2020   CO2 20 12/02/2020   BUN 9 12/02/2020   CREATININE 0.88 12/02/2020   GFRNONAA 78 12/02/2020   GFRAA 90 12/02/2020   CALCIUM 9.2 12/02/2020   PROT 6.8 12/02/2020   ALBUMIN 4.3 12/02/2020   LABGLOB 2.5 12/02/2020   AGRATIO 1.7 12/02/2020   BILITOT 0.4 12/02/2020   ALKPHOS 93 12/02/2020   AST 17 12/02/2020   ALT 23 12/02/2020   ANIONGAP 9 06/07/2017      Objective    BP (!) 165/88   Temp (!) 78 F (25.6 C) (Oral)    Physical Exam    Patient is alert and oriented and responsive to questions Engages in conversation with provider. Speaks in full sentences without any pauses without any shortness of breath or distress.     Assessment & Plan     Acute non-recurrent maxillary sinusitis - Plan: amoxicillin-clavulanate (AUGMENTIN) 875-125 MG tablet, predniSONE (STERAPRED UNI-PAK 21 TAB) 10 MG (21) TBPK tablet, benzonatate (TESSALON PERLES) 100 MG capsule  Meds ordered this encounter  Medications  . amoxicillin-clavulanate (AUGMENTIN) 875-125 MG tablet    Sig: Take 1 tablet by mouth 2 (two) times daily for 10 days.    Dispense:  20 tablet    Refill:  0  .  predniSONE (STERAPRED UNI-PAK 21 TAB) 10 MG (21) TBPK tablet    Sig: PO: Take 6 tablets on day 1:Take 5 tablets day 2:Take 4 tablets day 3: Take 3 tablets day 4:Take 2 tablets day five: 5 Take 1 tablet day 6    Dispense:  21 tablet    Refill:  0  . benzonatate (TESSALON PERLES) 100 MG capsule    Sig: Take 2 capsules (200 mg total) by mouth 3 (three) times daily as needed for cough.    Dispense:  30 capsule    Refill:  0    Red Flags discussed. The patient was given clear instructions to go to ER or return to medical center if any red flags develop, symptoms do not improve, worsen or new problems develop. They verbalized understanding.   Return if symptoms worsen or fail to improve, for Go to Emergency room/ urgent care if worse, at any time for any worsening symptoms.     I discussed the assessment and treatment plan  with the patient. The patient was provided an opportunity to ask questions and all were answered. The patient agreed with the plan and demonstrated an understanding of the instructions.   The patient was advised to call back or seek an in-person evaluation if the symptoms worsen or if the condition fails to improve as anticipated.  I provided 30  minutes of non-face-to-face time during this encounter.  The entirety of the information documented in the History of Present Illness, Review of Systems and Physical Exam were personally obtained by me. Portions of this information were initially documented by the CMA and reviewed by me for thoroughness and accuracy.   I discussed the limitations of evaluation and management by telemedicine and the availability of in person appointments. The patient expressed understanding and agreed to proceed.    Marcille Buffy, [place] [phone] (phone) [phone] (fax)  [place]

## 2021-01-06 ENCOUNTER — Encounter: Payer: Self-pay | Admitting: Adult Health

## 2021-01-06 ENCOUNTER — Telehealth (INDEPENDENT_AMBULATORY_CARE_PROVIDER_SITE_OTHER): Payer: BC Managed Care – PPO | Admitting: Adult Health

## 2021-01-06 DIAGNOSIS — R102 Pelvic and perineal pain: Secondary | ICD-10-CM | POA: Insufficient documentation

## 2021-01-06 DIAGNOSIS — J0100 Acute maxillary sinusitis, unspecified: Secondary | ICD-10-CM

## 2021-01-06 MED ORDER — AMOXICILLIN-POT CLAVULANATE 875-125 MG PO TABS: 1.0000 | ORAL_TABLET | Freq: Two times a day (BID) | ORAL | 0 refills | Status: AC

## 2021-01-06 MED ORDER — PREDNISONE 10 MG (21) PO TBPK: ORAL_TABLET | ORAL | 0 refills | Status: DC

## 2021-01-06 MED ORDER — BENZONATATE 100 MG PO CAPS: 200.0000 mg | ORAL_CAPSULE | Freq: Three times a day (TID) | ORAL | 0 refills | Status: DC | PRN

## 2021-01-06 NOTE — Patient Instructions (Signed)

## 2021-01-18 ENCOUNTER — Other Ambulatory Visit: Payer: Self-pay | Admitting: Physician Assistant

## 2021-01-18 ENCOUNTER — Encounter: Payer: Self-pay | Admitting: Physician Assistant

## 2021-01-18 DIAGNOSIS — I10 Essential (primary) hypertension: Secondary | ICD-10-CM

## 2021-01-19 MED ORDER — HYDROCHLOROTHIAZIDE 12.5 MG PO CAPS: 12.5000 mg | ORAL_CAPSULE | Freq: Every day | ORAL | 3 refills | Status: DC

## 2021-01-19 MED ORDER — LOSARTAN POTASSIUM 50 MG PO TABS: 50.0000 mg | ORAL_TABLET | Freq: Every day | ORAL | 3 refills | Status: DC

## 2021-01-26 ENCOUNTER — Other Ambulatory Visit: Payer: Self-pay

## 2021-01-26 ENCOUNTER — Ambulatory Visit
Admission: RE | Admit: 2021-01-26 | Discharge: 2021-01-26 | Disposition: A | Discharge status: Home / Self Care | Payer: BC Managed Care – PPO | Source: Ambulatory Visit | Attending: Physician Assistant | Admitting: Physician Assistant

## 2021-01-26 DIAGNOSIS — Z1239 Encounter for other screening for malignant neoplasm of breast: Secondary | ICD-10-CM

## 2021-01-26 DIAGNOSIS — Z803 Family history of malignant neoplasm of breast: Secondary | ICD-10-CM | POA: Diagnosis not present

## 2021-01-26 DIAGNOSIS — Z1231 Encounter for screening mammogram for malignant neoplasm of breast: Secondary | ICD-10-CM | POA: Diagnosis not present

## 2021-01-29 ENCOUNTER — Other Ambulatory Visit: Payer: Self-pay | Admitting: Physician Assistant

## 2021-01-29 DIAGNOSIS — R928 Other abnormal and inconclusive findings on diagnostic imaging of breast: Secondary | ICD-10-CM

## 2021-01-29 DIAGNOSIS — N6320 Unspecified lump in the left breast, unspecified quadrant: Secondary | ICD-10-CM

## 2021-02-05 ENCOUNTER — Ambulatory Visit
Admission: RE | Admit: 2021-02-05 | Discharge: 2021-02-05 | Disposition: A | Discharge status: Home / Self Care | Payer: BC Managed Care – PPO | Source: Ambulatory Visit | Attending: Physician Assistant | Admitting: Physician Assistant

## 2021-02-05 ENCOUNTER — Other Ambulatory Visit: Payer: Self-pay

## 2021-02-05 DIAGNOSIS — R928 Other abnormal and inconclusive findings on diagnostic imaging of breast: Secondary | ICD-10-CM | POA: Insufficient documentation

## 2021-02-05 DIAGNOSIS — N6320 Unspecified lump in the left breast, unspecified quadrant: Secondary | ICD-10-CM | POA: Insufficient documentation

## 2021-02-20 ENCOUNTER — Ambulatory Visit: Payer: BC Managed Care – PPO | Admitting: Podiatry

## 2021-02-20 ENCOUNTER — Encounter: Payer: Self-pay | Admitting: Podiatry

## 2021-02-20 ENCOUNTER — Other Ambulatory Visit: Payer: Self-pay

## 2021-02-20 DIAGNOSIS — B070 Plantar wart: Secondary | ICD-10-CM | POA: Diagnosis not present

## 2021-02-20 NOTE — Progress Notes (Signed)
   Subjective: 50 y.o. female presenting for new complaint regarding skin lesions to the bilateral plantar feet that has been present for approximately 1 year now.  Patient states that she has 2 lesions on her right foot and one lesion on her left foot that is very symptomatic and painful.  She has been applying Compound W with with minimal improvement.  She presents for further treatment evaluation    Past Medical History:  Diagnosis Date  . BP (high blood pressure) 03/22/2008  . Fatigue 04/18/2015  . Fibroid 04/18/2015  . Gastroesophageal reflux disease 12/03/2016  . Headache, migraine 03/22/2008  . Hypertension     Objective: Physical Exam General: The patient is alert and oriented x3 in no acute distress.   Dermatology: Hyperkeratotic skin lesions noted to the plantar aspect of the bilateral feet approximately 1 cm in diameter. Pinpoint bleeding noted upon debridement. Skin is warm, dry and supple bilateral lower extremities. Negative for open lesions or macerations.   Vascular: Palpable pedal pulses bilaterally. No edema or erythema noted. Capillary refill within normal limits.   Neurological: Epicritic and protective threshold grossly intact bilaterally.    Musculoskeletal Exam: Pain on palpation to the noted skin lesions.  Range of motion within normal limits to all pedal and ankle joints bilateral. Muscle strength 5/5 in all groups bilateral.    Assessment: 1. plantar wart bilateral feet    Plan of Care:  1. Patient was evaluated. 2. Excisional debridement of the plantar wart lesion(s) was performed using a chisel blade. Cantharone was applied and the lesion(s) was dressed with a dry sterile dressing. 3. patient is to return to clinic in 2 weeks, at this time we may prescribe Efudex if there is significant improvement to use at home  Works at [employer] sitting and standing  Edrick Kins, DPM [facility]  Dr. Edrick Kins, DPM    [address], [place] [address]                Office [phone]  Fax [phone]

## 2021-03-06 ENCOUNTER — Other Ambulatory Visit: Payer: Self-pay

## 2021-03-06 ENCOUNTER — Ambulatory Visit: Payer: BC Managed Care – PPO | Admitting: Podiatry

## 2021-03-06 ENCOUNTER — Encounter: Payer: Self-pay | Admitting: Podiatry

## 2021-03-06 DIAGNOSIS — B070 Plantar wart: Secondary | ICD-10-CM | POA: Diagnosis not present

## 2021-03-06 NOTE — Progress Notes (Signed)
   Subjective: 50 y.o. female presenting for follow-up evaluation of plantar warts to the bilateral plantar feet that has been present for approximately 1 year now.  Patient states that she has 2 lesions on her right foot and one lesion on her left foot that is very symptomatic and painful.  Last visit she tolerated the Cantharone very well.  She presents for further treatment and evaluation   Past Medical History:  Diagnosis Date  . BP (high blood pressure) 03/22/2008  . Fatigue 04/18/2015  . Fibroid 04/18/2015  . Gastroesophageal reflux disease 12/03/2016  . Headache, migraine 03/22/2008  . Hypertension     Objective: Physical Exam General: The patient is alert and oriented x3 in no acute distress.   Dermatology: Hyperkeratotic skin lesions noted to the plantar aspect of the bilateral feet approximately 1 cm in diameter. Pinpoint bleeding noted upon debridement. Skin is warm, dry and supple bilateral lower extremities. Negative for open lesions or macerations.   Vascular: Palpable pedal pulses bilaterally. No edema or erythema noted. Capillary refill within normal limits.   Neurological: Epicritic and protective threshold grossly intact bilaterally.    Musculoskeletal Exam: Pain on palpation to the noted skin lesions.  Range of motion within normal limits to all pedal and ankle joints bilateral. Muscle strength 5/5 in all groups bilateral.    Assessment: 1. plantar wart bilateral feet    Plan of Care:  1. Patient was evaluated. 2. Excisional debridement of the plantar wart lesion(s) was performed using a chisel blade. Cantharone was applied and the lesion(s) was dressed with light dressing 3. 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 Edrick Kins, DPM    [address], [place] [address]                Office [phone]  Fax [phone]

## 2021-03-20 ENCOUNTER — Encounter: Payer: Self-pay | Admitting: Podiatry

## 2021-03-20 ENCOUNTER — Ambulatory Visit: Payer: BC Managed Care – PPO | Admitting: Podiatry

## 2021-03-20 ENCOUNTER — Other Ambulatory Visit: Payer: Self-pay

## 2021-03-20 DIAGNOSIS — B070 Plantar wart: Secondary | ICD-10-CM | POA: Diagnosis not present

## 2021-03-20 NOTE — Progress Notes (Signed)
   Subjective: 50 y.o. female presenting for follow-up evaluation of plantar warts to the bilateral plantar feet that has been present for approximately 1 year now.  No new complaints at this time.  Overall she states that there is some improvement   Past Medical History:  Diagnosis Date  . BP (high blood pressure) 03/22/2008  . Fatigue 04/18/2015  . Fibroid 04/18/2015  . Gastroesophageal reflux disease 12/03/2016  . Headache, migraine 03/22/2008  . Hypertension     Objective: Physical Exam General: The patient is alert and oriented x3 in no acute distress.   Dermatology: Hyperkeratotic skin lesions noted to the plantar aspect of the bilateral feet approximately 1 cm in diameter. Pinpoint bleeding noted upon debridement. Skin is warm, dry and supple bilateral lower extremities. Negative for open lesions or macerations.   Vascular: Palpable pedal pulses bilaterally. No edema or erythema noted. Capillary refill within normal limits.   Neurological: Epicritic and protective threshold grossly intact bilaterally.    Musculoskeletal Exam: Pain on palpation to the noted skin lesions.  Range of motion within normal limits to all pedal and ankle joints bilateral. Muscle strength 5/5 in all groups bilateral.    Assessment: 1. plantar wart bilateral feet    Plan of Care:  1. Patient was evaluated. 2. Excisional debridement of the plantar wart lesion(s) was performed using a chisel blade. Salicylic acid was applied and the lesion(s) was dressed with light dressing 3.  Patient has plenty of OTC wart remover at home.  Instead of Efudex she will resume the OTC wart remover at home daily. 4.  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 Edrick Kins, DPM    [address], [place] [address]                Office [phone]  Fax [phone]

## 2021-03-29 ENCOUNTER — Encounter: Payer: Self-pay | Admitting: Physician Assistant

## 2021-04-24 ENCOUNTER — Encounter: Payer: Self-pay | Admitting: Podiatry

## 2021-04-24 ENCOUNTER — Other Ambulatory Visit: Payer: Self-pay

## 2021-04-24 ENCOUNTER — Ambulatory Visit: Payer: BC Managed Care – PPO | Admitting: Podiatry

## 2021-04-24 DIAGNOSIS — B070 Plantar wart: Secondary | ICD-10-CM

## 2021-04-24 MED ORDER — FLUOROURACIL 5 % EX CREA: TOPICAL_CREAM | Freq: Two times a day (BID) | CUTANEOUS | 0 refills | Status: DC

## 2021-04-24 MED ORDER — FLUOROURACIL 5 % EX CREA: TOPICAL_CREAM | Freq: Two times a day (BID) | CUTANEOUS | 2 refills | Status: DC

## 2021-04-24 NOTE — Progress Notes (Signed)
   Subjective: 50 y.o. female presenting for follow-up evaluation of plantar warts to the bilateral plantar feet that has been present for approximately 1 year now.  No new complaints at this time.  Patient states that the warts are about the same.  There is minimal improvement.  She has been applying OTC salicylic acid daily.   Past Medical History:  Diagnosis Date  . BP (high blood pressure) 03/22/2008  . Fatigue 04/18/2015  . Fibroid 04/18/2015  . Gastroesophageal reflux disease 12/03/2016  . Headache, migraine 03/22/2008  . Hypertension     Objective: Physical Exam General: The patient is alert and oriented x3 in no acute distress.   Dermatology: Hyperkeratotic skin lesions noted to the plantar aspect of the bilateral feet approximately 1 cm in diameter. Pinpoint bleeding noted upon debridement. Skin is warm, dry and supple bilateral lower extremities. Negative for open lesions or macerations.   Vascular: Palpable pedal pulses bilaterally. No edema or erythema noted. Capillary refill within normal limits.   Neurological: Epicritic and protective threshold grossly intact bilaterally.    Musculoskeletal Exam: Pain on palpation to the noted skin lesions.  Range of motion within normal limits to all pedal and ankle joints bilateral. Muscle strength 5/5 in all groups bilateral.    Assessment: 1. plantar wart bilateral feet    Plan of Care:  1. Patient was evaluated. 2. Excisional debridement of the plantar wart lesion(s) was performed using a chisel blade. Salicylic acid was applied and the lesion(s) was dressed with light dressing 3.  Prescription for Efudex 5% apply 2 times daily 4.  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 Edrick Kins, DPM    [address], [place] [address]                Office [phone]   Fax [phone]

## 2021-05-14 ENCOUNTER — Telehealth: Payer: BC Managed Care – PPO | Admitting: Nurse Practitioner

## 2021-05-14 ENCOUNTER — Encounter: Payer: Self-pay | Admitting: Nurse Practitioner

## 2021-05-14 VITALS — BP 154/77 | HR 82

## 2021-05-14 DIAGNOSIS — J019 Acute sinusitis, unspecified: Secondary | ICD-10-CM

## 2021-05-14 MED ORDER — DOXYCYCLINE HYCLATE 100 MG PO TABS: 100.0000 mg | ORAL_TABLET | Freq: Two times a day (BID) | ORAL | 0 refills | Status: DC

## 2021-05-14 NOTE — Progress Notes (Signed)
Acute Office Visit  Subjective:    Patient ID: Rachel Wells, female    DOB: 15-Jun-1971, 50 y.o.   MRN: [id number]  Chief Complaint  Patient presents with  . Cough    Started about 2 weeks ago with a slight headache. Has been taking tylenolol and coricidin cold and cough   . Sore Throat  . sinus pressure    HPI Patient is in today for sinus pressure, nasal congestion, and cough for 2 weeks. Her symptoms have gotten worse and now her teeth are hurting and she is having sinus pressure. She had a negative covid-19 home test last night. She has a history of sinus infections.   UPPER RESPIRATORY TRACT INFECTION  Worst symptom: sinus pressure Fever: no Cough: yes Shortness of breath: no Wheezing: no Chest pain: no Chest tightness: no Chest congestion: no Nasal congestion: yes Runny nose: yes Post nasal drip: yes Sneezing: no Sore throat: yes Swollen glands: no Sinus pressure: yes Headache: yes Face pain: no Toothache: yes Ear pain: yes bilateral Ear pressure: no  Eyes red/itching:no Eye drainage/crusting: no  Vomiting: no Rash: no Fatigue: yes Sick contacts: no Strep contacts: no  Context: worse Recurrent sinusitis: no Relief with OTC cold/cough medications: some  Treatments attempted: cold/sinus, mucinex and cough syrup    Past Medical History:  Diagnosis Date  . BP (high blood pressure) 03/22/2008  . Fatigue 04/18/2015  . Fibroid 04/18/2015  . Gastroesophageal reflux disease 12/03/2016  . Headache, migraine 03/22/2008  . Hypertension     Past Surgical History:  Procedure Laterality Date  . DILATION AND CURETTAGE OF UTERUS  1992  . TONSILLECTOMY AND ADENOIDECTOMY      Family History  Problem Relation Age of Onset  . Fibromyalgia Mother   . Cancer Mother 101       breast cancer  . Hypertension Mother   . Thyroid disease Mother   . Breast cancer Mother 70  . Hypertension Father   . Diabetes Brother   . Hypertension Paternal Grandmother   . Stroke  Paternal Grandmother   . Heart disease Paternal Grandfather     Social History   Socioeconomic History  . Marital status: Married    Spouse name: Not on file  . Number of children: Not on file  . Years of education: Not on file  . Highest education level: Not on file  Occupational History  . Not on file  Tobacco Use  . Smoking status: Never Smoker  . Smokeless tobacco: Never Used  Vaping Use  . Vaping Use: Never used  Substance and Sexual Activity  . Alcohol use: No    Alcohol/week: 0.0 standard drinks  . Drug use: No  . Sexual activity: Not on file  Other Topics Concern  . Not on file  Social History Narrative  . Not on file   Social Determinants of Health   Financial Resource Strain: Not on file  Food Insecurity: Not on file  Transportation Needs: Not on file  Physical Activity: Not on file  Stress: Not on file  Social Connections: Not on file  Intimate Partner Violence: Not on file    Outpatient Medications Prior to Visit  Medication Sig Dispense Refill  . cetirizine (ZYRTEC) 10 MG tablet Take 1 tablet (10 mg total) by mouth daily. 90 tablet 3  . Cholecalciferol (VITAMIN D3 PO) Take 50 mg by mouth.    . ELDERBERRY PO Take by mouth daily.    Marland Kitchen EPINEPHrine 0.3 mg/0.3 mL IJ SOAJ injection  Inject 0.3 mg into the muscle as needed for anaphylaxis. 1 each 3  . fluorouracil (EFUDEX) 5 % cream Apply topically 2 (two) times daily. 40 g 2  . hydrochlorothiazide (MICROZIDE) 12.5 MG capsule Take 1 capsule (12.5 mg total) by mouth daily. 90 capsule 3  . losartan (COZAAR) 50 MG tablet Take 1 tablet (50 mg total) by mouth daily. 90 tablet 3  . metoprolol succinate (TOPROL-XL) 50 MG 24 hr tablet TAKE 1 TABLET BY MOUTH ONCE DAILY IMMEDIATELY  FOLLOWING  A  MEAL 90 tablet 3  . MIRENA, 52 MG, 20 MCG/24HR IUD     . MULTIPLE VITAMIN PO Take 1 tablet by mouth daily.    Marland Kitchen omeprazole (PRILOSEC) 20 MG capsule Take 1 capsule (20 mg total) by mouth daily. 90 capsule 3  . Probiotic Product  (ALIGN PO) Take by mouth.    . benzonatate (TESSALON PERLES) 100 MG capsule Take 2 capsules (200 mg total) by mouth 3 (three) times daily as needed for cough. (Patient not taking: Reported on 05/14/2021) 30 capsule 0  . meloxicam (MOBIC) 15 MG tablet Take 1 tablet (15 mg total) by mouth daily. (Patient not taking: No sig reported) 90 tablet 1   No facility-administered medications prior to visit.    Allergies  Allergen Reactions  . Azithromycin Other (See Comments)  . Shrimp [Shellfish Allergy] Swelling  . Neomycin-Bacitracin Zn-Polymyx Itching and Rash    REACTION: rash, itch    Review of Systems  Constitutional: Positive for chills and fatigue. Negative for fever.  HENT: Positive for congestion, ear pain, postnasal drip, rhinorrhea, sinus pressure, sinus pain and sore throat.   Eyes: Negative.   Respiratory: Positive for cough. Negative for shortness of breath.   Cardiovascular: Negative.   Gastrointestinal: Negative.   Genitourinary: Negative.   Musculoskeletal: Negative.   Neurological: Positive for headaches. Negative for dizziness.       Objective:    Physical Exam Vitals and nursing note reviewed.  Pulmonary:     Comments: Able to talk in complete sentences Neurological:     Mental Status: She is alert and oriented to person, place, and time.  Psychiatric:        Thought Content: Thought content normal.        Judgment: Judgment normal.     BP (!) 154/77   Pulse 82  Wt Readings from Last 3 Encounters:  11/05/20 227 lb (103 kg)  04/26/19 220 lb (99.8 kg)  01/03/19 219 lb (99.3 kg)    There are no preventive care reminders to display for this patient.  There are no preventive care reminders to display for this patient.   Lab Results  Component Value Date   TSH 1.320 11/05/2020   Lab Results  Component Value Date   WBC 7.4 11/05/2020   HGB 13.2 11/05/2020   HCT 38.8 11/05/2020   MCV 85 11/05/2020   PLT 328 11/05/2020   Lab Results  Component Value  Date   NA 139 12/02/2020   K 3.8 12/02/2020   CO2 20 12/02/2020   GLUCOSE 80 12/02/2020   BUN 9 12/02/2020   CREATININE 0.88 12/02/2020   BILITOT 0.4 12/02/2020   ALKPHOS 93 12/02/2020   AST 17 12/02/2020   ALT 23 12/02/2020   PROT 6.8 12/02/2020   ALBUMIN 4.3 12/02/2020   CALCIUM 9.2 12/02/2020   ANIONGAP 9 06/07/2017   Lab Results  Component Value Date   CHOL 161 11/05/2020   Lab Results  Component Value Date  HDL 39 (L) 11/05/2020   Lab Results  Component Value Date   LDLCALC 101 (H) 11/05/2020   Lab Results  Component Value Date   TRIG 115 11/05/2020   No results found for: [facility] Lab Results  Component Value Date   HGBA1C 5.7 (H) 11/05/2020       Assessment & Plan:   Problem List Items Addressed This Visit   None   Visit Diagnoses    Acute non-recurrent sinusitis, unspecified location    -  Primary   Symptoms x14 days. She says augmentin upsets her stomach. Will send in doxycycline. Encouraged rest, fluids, tylenol, coricidin. F/U if symptoms worsen   Relevant Medications   doxycycline (VIBRA-TABS) 100 MG tablet       Meds ordered this encounter  Medications  . doxycycline (VIBRA-TABS) 100 MG tablet    Sig: Take 1 tablet (100 mg total) by mouth 2 (two) times daily.    Dispense:  14 tablet    Refill:  0    . This visit was completed via telephone due to the restrictions of the COVID-19 pandemic. All issues as above were discussed and addressed but no physical exam was performed. If it was felt that the patient should be evaluated in the office, they were directed there. The patient verbally consented to this visit. Patient was unable to complete an audio/visual visit due to Technical difficulties, Lack of internet. Due to the catastrophic nature of the COVID-19 pandemic, this visit was done through audio contact only. . Location of the patient: home . Location of the provider: work . Those involved with this call:  . Provider: Billy Fischer,  DNP . CMA: Frazier Butt, CMA . Front Desk/Registration: Jill Side  . Time spent on call: 10 minutes on the phone discussing health concerns. 10 minutes total spent in review of patient's record and preparation of their chart.    Charyl Dancer, NP

## 2021-05-14 NOTE — Patient Instructions (Signed)
Sinusitis, Adult Sinusitis is inflammation of your sinuses. Sinuses are hollow spaces in the bones around your face. Your sinuses are located:  Around your eyes.  In the middle of your forehead.  Behind your nose.  In your cheekbones. Mucus normally drains out of your sinuses. When your nasal tissues become inflamed or swollen, mucus can become trapped or blocked. This allows bacteria, viruses, and fungi to grow, which leads to infection. Most infections of the sinuses are caused by a virus. Sinusitis can develop quickly. It can last for up to 4 weeks (acute) or for more than 12 weeks (chronic). Sinusitis often develops after a cold. What are the causes? This condition is caused by anything that creates swelling in the sinuses or stops mucus from draining. This includes:  Allergies.  Asthma.  Infection from bacteria or viruses.  Deformities or blockages in your nose or sinuses.  Abnormal growths in the nose (nasal polyps).  Pollutants, such as chemicals or irritants in the air.  Infection from fungi (rare). What increases the risk? You are more likely to develop this condition if you:  Have a weak body defense system (immune system).  Do a lot of swimming or diving.  Overuse nasal sprays.  Smoke. What are the signs or symptoms? The main symptoms of this condition are pain and a feeling of pressure around the affected sinuses. Other symptoms include:  Stuffy nose or congestion.  Thick drainage from your nose.  Swelling and warmth over the affected sinuses.  Headache.  Upper toothache.  A cough that may get worse at night.  Extra mucus that collects in the throat or the back of the nose (postnasal drip).  Decreased sense of smell and taste.  Fatigue.  A fever.  Sore throat.  Bad breath. How is this diagnosed? This condition is diagnosed based on:  Your symptoms.  Your medical history.  A physical exam.  Tests to find out if your condition is  acute or chronic. This may include: ? Checking your nose for nasal polyps. ? Viewing your sinuses using a device that has a light (endoscope). ? Testing for allergies or bacteria. ? Imaging tests, such as an MRI or CT scan. In rare cases, a bone biopsy may be done to rule out more serious types of fungal sinus disease. How is this treated? Treatment for sinusitis depends on the cause and whether your condition is chronic or acute.  If caused by a virus, your symptoms should go away on their own within 10 days. You may be given medicines to relieve symptoms. They include: ? Medicines that shrink swollen nasal passages (topical intranasal decongestants). ? Medicines that treat allergies (antihistamines). ? A spray that eases inflammation of the nostrils (topical intranasal corticosteroids). ? Rinses that help get rid of thick mucus in your nose (nasal saline washes).  If caused by bacteria, your health care provider may recommend waiting to see if your symptoms improve. Most bacterial infections will get better without antibiotic medicine. You may be given antibiotics if you have: ? A severe infection. ? A weak immune system.  If caused by narrow nasal passages or nasal polyps, you may need to have surgery. Follow these instructions at home: Medicines  Take, use, or apply over-the-counter and prescription medicines only as told by your health care provider. These may include nasal sprays.  If you were prescribed an antibiotic medicine, take it as told by your health care provider. Do not stop taking the antibiotic even if you start  to feel better. Hydrate and humidify  Drink enough fluid to keep your urine pale yellow. Staying hydrated will help to thin your mucus.  Use a cool mist humidifier to keep the humidity level in your home above 50%.  Inhale steam for 10-15 minutes, 3-4 times a day, or as told by your health care provider. You can do this in the bathroom while a hot shower is  running.  Limit your exposure to cool or dry air.   Rest  Rest as much as possible.  Sleep with your head raised (elevated).  Make sure you get enough sleep each night. General instructions  Apply a warm, moist washcloth to your face 3-4 times a day or as told by your health care provider. This will help with discomfort.  Wash your hands often with soap and water to reduce your exposure to germs. If soap and water are not available, use hand sanitizer.  Do not smoke. Avoid being around people who are smoking (secondhand smoke).  Keep all follow-up visits as told by your health care provider. This is important.   Contact a health care provider if:  You have a fever.  Your symptoms get worse.  Your symptoms do not improve within 10 days. Get help right away if:  You have a severe headache.  You have persistent vomiting.  You have severe pain or swelling around your face or eyes.  You have vision problems.  You develop confusion.  Your neck is stiff.  You have trouble breathing. Summary  Sinusitis is soreness and inflammation of your sinuses. Sinuses are hollow spaces in the bones around your face.  This condition is caused by nasal tissues that become inflamed or swollen. The swelling traps or blocks the flow of mucus. This allows bacteria, viruses, and fungi to grow, which leads to infection.  If you were prescribed an antibiotic medicine, take it as told by your health care provider. Do not stop taking the antibiotic even if you start to feel better.  Keep all follow-up visits as told by your health care provider. This is important. This information is not intended to replace advice given to you by your health care provider. Make sure you discuss any questions you have with your health care provider. Document Revised: 05/01/2018 Document Reviewed: 05/01/2018 Elsevier Patient Education  2021 [employer].

## 2021-05-19 ENCOUNTER — Telehealth: Payer: Self-pay | Admitting: Physician Assistant

## 2021-05-19 DIAGNOSIS — J019 Acute sinusitis, unspecified: Secondary | ICD-10-CM

## 2021-05-19 MED ORDER — AMOXICILLIN 500 MG PO CAPS
1000.0000 mg | ORAL_CAPSULE | Freq: Three times a day (TID) | ORAL | 0 refills | Status: AC
Start: 2021-05-19 — End: 2021-05-24

## 2021-05-19 NOTE — Telephone Encounter (Signed)
Pt seen Lauren at [facility] and states that she was advised to call pcp if she had any issues/ pt states that the antibiotic she was prescribed is not helping her sinus infection and would like to have something called into pharmacy to give more relief/ please advise

## 2021-05-22 ENCOUNTER — Ambulatory Visit: Payer: BC Managed Care – PPO | Admitting: Podiatry

## 2021-05-26 ENCOUNTER — Ambulatory Visit: Payer: BC Managed Care – PPO | Admitting: Podiatry

## 2021-06-09 ENCOUNTER — Encounter: Payer: Self-pay | Admitting: Adult Health

## 2021-06-12 ENCOUNTER — Other Ambulatory Visit: Payer: Self-pay

## 2021-06-12 ENCOUNTER — Ambulatory Visit: Payer: BC Managed Care – PPO | Admitting: Podiatry

## 2021-06-12 DIAGNOSIS — B070 Plantar wart: Secondary | ICD-10-CM | POA: Diagnosis not present

## 2021-06-18 ENCOUNTER — Ambulatory Visit: Payer: BC Managed Care – PPO | Admitting: Adult Health

## 2021-06-23 NOTE — Progress Notes (Signed)
   Subjective: 50 y.o. female presenting for follow-up evaluation of plantar warts to the bilateral plantar feet that has been present for approximately 1 year now.  No new complaints at this time.  Patient states that the warts are about the same.  There is minimal improvement.  She has been applying OTC salicylic acid daily.   Past Medical History:  Diagnosis Date   BP (high blood pressure) 03/22/2008   Fatigue 04/18/2015   Fibroid 04/18/2015   Gastroesophageal reflux disease 12/03/2016   Headache, migraine 03/22/2008   Hypertension     Objective: Physical Exam General: The patient is alert and oriented x3 in no acute distress.   Dermatology: Hyperkeratotic skin lesions noted to the plantar aspect of the bilateral feet approximately 1 cm in diameter. Pinpoint bleeding noted upon debridement. Skin is warm, dry and supple bilateral lower extremities. Negative for open lesions or macerations.   Vascular: Palpable pedal pulses bilaterally. No edema or erythema noted. Capillary refill within normal limits.   Neurological: Epicritic and protective threshold grossly intact bilaterally.    Musculoskeletal Exam: Pain on palpation to the noted skin lesions.  Range of motion within normal limits to all pedal and ankle joints bilateral. Muscle strength 5/5 in all groups bilateral.    Assessment: 1. plantar wart bilateral feet    Plan of Care:  1. Patient was evaluated. 2.  The patient has not dealt with these wart lesions for about 1 year now and multiple topical treatment modalities have failed in providing any lasting relief or curative measures for the warts.  Decision was made today to excisionally remove the warts.  The patient agrees.  All possible complications and details of the procedure were explained. 3.  Prior to excisional removal of the warts, the warts were blocked locally with lidocaine in a localized block fashion totaling 6 mL for the bilateral feet 4.  The warts were  circumscribed using a surgical 11 scalpel and curettage was utilized to completely remove the wart lesions 5.  Wart lesions were placed in a sterile specimen container and sent to pathology 6.  Light dressing applied with post care instructions provided 7.  Postsurgical shoes dispensed 8.  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 Edrick Kins, DPM    [address], [place] [address]                Office [phone]  Fax [phone]

## 2021-06-26 ENCOUNTER — Other Ambulatory Visit: Payer: Self-pay

## 2021-06-26 ENCOUNTER — Ambulatory Visit: Payer: BC Managed Care – PPO | Admitting: Podiatry

## 2021-06-26 DIAGNOSIS — B070 Plantar wart: Secondary | ICD-10-CM

## 2021-06-26 NOTE — Progress Notes (Signed)
   Subjective: Patient presents today for follow-up evaluation of a plantar wart to the bilateral lower extremity.  Last visit on 06/12/2021 decision was made to excisionally removed the plantar verruca lesions.  She presents for follow-up treatment and evaluation  Past Medical History:  Diagnosis Date   BP (high blood pressure) 03/22/2008   Fatigue 04/18/2015   Fibroid 04/18/2015   Gastroesophageal reflux disease 12/03/2016   Headache, migraine 03/22/2008   Hypertension     Objective: Physical Exam General: The patient is alert and oriented x3 in no acute distress.   Dermatology: Hyperkeratotic skin lesion noted to the plantar aspect of the bilateral foot approximately 1 cm in diameter. Pinpoint bleeding noted upon debridement. Skin is warm, dry and supple bilateral lower extremities. Negative for open lesions or macerations.   Vascular: Palpable pedal pulses bilaterally. No edema or erythema noted. Capillary refill within normal limits.   Neurological: Epicritic and protective threshold grossly intact bilaterally.    Musculoskeletal Exam: Sensitivity to palpation to the noted skin lesions.  No pedal deformities noted  Assessment: #1 plantar wart bilateral foot #2 pain in bilateral foot #3 status post excisional removal of plantar verruca lesions bilateral.  Date of procedure: 06/12/2021     Plan of Care:  #1 Patient was evaluated. #2  Right debridement of the plantar wart lesions were performed using a tissue nipper  #3 OTC salicylic acid was provided for the patient to begin applying in 1 week to allow the wounds to finally heal. #4 return to clinic as needed  *Works at [employer] sitting and standing in [employer].       Edrick Kins, DPM [facility]  Dr. Edrick Kins, [address]                                        [place], [place] [address]                Office [phone]  Fax [phone]

## 2021-07-16 ENCOUNTER — Other Ambulatory Visit: Payer: Self-pay | Admitting: Obstetrics and Gynecology

## 2021-07-16 DIAGNOSIS — N6002 Solitary cyst of left breast: Secondary | ICD-10-CM

## 2021-07-24 ENCOUNTER — Other Ambulatory Visit: Payer: Self-pay | Admitting: Obstetrics and Gynecology

## 2021-07-30 ENCOUNTER — Encounter: Payer: Self-pay | Admitting: Adult Health

## 2021-08-04 ENCOUNTER — Other Ambulatory Visit: Payer: Self-pay

## 2021-08-04 ENCOUNTER — Ambulatory Visit
Admission: RE | Admit: 2021-08-04 | Discharge: 2021-08-04 | Disposition: A | Discharge status: Home / Self Care | Payer: BC Managed Care – PPO | Source: Ambulatory Visit | Attending: Obstetrics and Gynecology | Admitting: Obstetrics and Gynecology

## 2021-08-04 DIAGNOSIS — N6002 Solitary cyst of left breast: Secondary | ICD-10-CM | POA: Diagnosis not present

## 2021-08-07 ENCOUNTER — Other Ambulatory Visit: Payer: Self-pay | Admitting: Obstetrics and Gynecology

## 2021-08-07 DIAGNOSIS — N6320 Unspecified lump in the left breast, unspecified quadrant: Secondary | ICD-10-CM

## 2021-08-27 ENCOUNTER — Ambulatory Visit: Payer: BC Managed Care – PPO | Admitting: Adult Health

## 2021-09-08 NOTE — H&P (Signed)
Rachel Wells is a 50 y.o. female presenting with Pre Op Consulting  History of Present Illness: Pt presents for a consult for surgical management of her excessive and frequent menstruation, dysmenorrhea and uterine leiomyomas. She has a long hx of bleeding and dysmenorrhea with known uterine fibroids. She has failed medical management with IUD and does have HTN on meds.  Workup: Pap 11/2019  Neg/neg  EMBx 06/08/2021 Specimen A-Endometrial Biopsy: RARE BENIGN ENDOMETRIAL  FRAGMENTS ARE SEEN IN A SPECIMEN THAT CONSISTS MAINLY OF  MUCUS, BLOOD, AND ENDOCERVICAL GLANDS.  TVUS 06/08/2021 Uterus: anteverted 9.9 x 7.1 x 4.8 cm  3 fibroids seen: Rt ant fundal 3.8 cm, Rt posterior 3.0 cm, Lt post 3.5 cm  Other: IUD at fundus  EE: 8.4 mm, probable polyp 11.6 x 4.1 x 9.3 mm  Ovaries: bil WNL   TVUS 12/2019 for right sided pelvic pain, uterine fibroid hx Uterus 9.84 x 7.2 x 4.46 cm  3 Fibroids Seen: Ant fundal: 2 cm, Rt posterior: 3.8 cm, Lt posterior: 3.2 cm  IUD seen in fundus EE 7.47 mm LO 1.95 x 1.77 x 1.85 cm RO 2.67 x 2.11 x 1.56 cm  Pertinent hx: -New hot flashes over 2020-2021, FSH: 2.4, LH: 2.2, estrogens, total: 161 -IUD in place, since 05/2016 for menorrhagia, dysmenorrhea -Uterine fibroids, hx of menorrhagia, dysmenorrhea   -SVD x3  -Decreased libido ? Since IUD placed 05/2016 for dysmenorrhea, menorrhagia -Abnormal bleeding pattern, mostly spotting, occasional bleeding during menses.             -Urge incontinence associated with bleeding -Bilateral outer breast tenderness, not associated with bleeding -Vaginal dryness-->no intervention but Loestrin sent in to add to IUD; pt did not f/u on symptoms -HTN, on meds -Mother with breast cancer dx @ 104yo -15# weight gain in one year, 2017-2018, has maintained since then (228lbs) -Hx of D&C  Past Medical History:  has a past medical history of Allergic rhinitis, Gastroesophageal reflux, History of Papanicolaou smear of cervix  (03/01/2014), and Hypertension.  Past Surgical History:  has a past surgical history that includes Dilation and curettage of uterus; Tonsillectomy; and Colposcopy (1993). Family History: family history includes Breast cancer (age of onset: 64) in her mother; High blood pressure (Hypertension) in her father; Other in her paternal grandmother; Stroke in her paternal grandmother. Social History:  reports that she has never smoked. She has never used smokeless tobacco. She reports that she does not drink alcohol and does not use drugs. OB/GYN History:  OB History     Gravida  3   Para  3   Term  3   Preterm      AB      Living  3      SAB      IAB      Ectopic      Molar      Multiple      Live Births  3         Allergies: is allergic to azithromycin, shellfish derived, other, and neomycin-bacitracnzn-polymyxnb. Medications: Current Outpatient Medications:    cetirizine (ZYRTEC) 10 MG tablet, Take 10 mg by mouth once daily., Disp: , Rfl:    ELDERBERRY FRUIT ORAL, Take by mouth, Disp: , Rfl:    EPINEPHrine (EPIPEN) 0.3 mg/0.3 mL auto-injector, INJECT 0.3MG INTO THE MUSCLE AS NEEDED FOR ANAPHYLAXIS, Disp: , Rfl:    fluorouraciL (EFUDEX) 5 % cream, Apply topically 2 (two) times daily, Disp: , Rfl:    hydroCHLOROthiazide (MICROZIDE) 12.5 mg  capsule, Take 12.5 mg by mouth once daily, Disp: , Rfl:    levonorgestrel (MIRENA) 20 mcg/24 hr (5 years) IUD, Insert 1 each into the uterus once Follow package directions., Disp: , Rfl:    losartan (COZAAR) 50 MG tablet, Take 50 mg by mouth once daily, Disp: , Rfl:    metoprolol succinate (TOPROL-XL) 50 MG XL tablet, , Disp: , Rfl:    multivitamin tablet, Take by mouth., Disp: , Rfl:    omeprazole (PRILOSEC) 20 MG DR capsule, Take 20 mg by mouth once daily, Disp: , Rfl:    tolterodine (DETROL LA) 4 MG LA capsule, Take 1 capsule (4 mg total) by mouth once daily (Patient not taking: No sig reported), Disp: 30 capsule, Rfl: 6  Review of  Systems: No SOB, no palpitations or chest pain, no new lower extremity edema, no nausea or vomiting or bowel or bladder complaints. See HPI for gyn specific ROS.   Exam:   BP (!) 147/84   Pulse 87   Ht 160 cm (5' 3" )   Wt (!) 103.1 kg (227 lb 6.4 oz)   LMP 09/04/2021   BMI 40.28 kg/m   General: Patient is well-groomed, well-nourished, appears stated age in no acute distress   HEENT: head is atraumatic and normocephalic, trachea is midline, neck is supple with no palpable nodules   CV: Regular rhythm and normal heart rate, no murmur   Pulm: Clear to auscultation throughout lung fields with no wheezing, crackles, or rhonchi. No increased work of breathing  Abdomen: soft , no mass, non-tender, no rebound tenderness, no hepatomegaly  Pelvic: deferred  Impression:   The primary encounter diagnosis was Preop examination. Diagnoses of Excessive or frequent menstruation, Primary dysmenorrhea, and Uterine leiomyoma, unspecified location were also pertinent to this visit.  Plan:   1. Dysmenorrhea, menorrhagia, polyp: Failed medical management -Patient returns for a preoperative discussion regarding her plans to proceed with surgical treatment of her Fibroid uterus, Dysmenorrhea and Menorrhagia by total laparoscopic hysterectomy with bilateral salpingectomy  procedure.  We may perform a cystoscopy to evaluate the urinary tract after the procedure, if surgically indicated for uro tract integrity.   The patient and I discussed the technical aspects of the procedure including the potential for risks and complications. These include but are not limited to the risk of infection requiring post-operative antibiotics or further procedures.  We talked about the risk of injury to adjacent organs including bladder, bowel, ureter, blood vessels or nerves.  We talked about the need to convert to an open incision.  We talked about the possible need for blood transfusion.  We talked about postop  complications such as thromboembolic or cardiopulmonary complications.  All of her questions were answered.  Her preoperative exam was completed and the appropriate consents were signed. She is scheduled to undergo this procedure in the near future.  She is allergic to shellfish and will need Hibiclens prep, not betadine.   Diagnoses and all orders for this visit:  Preop examination  Excessive or frequent menstruation  Primary dysmenorrhea  Uterine leiomyoma, unspecified location   Return for post op .   ~~~~~~~~~~~~~~~~~~~~~~~~~~~~~~~~~~~~~~~~~~~~~~~~~~~~~~~~~~~~ This note is partially written by Geraldine Solar, in the presence of and acting as the scribe of Dr. Benjaman Kindler, who has reviewed, edited and added to the note to reflect her best personal medical judgment.  This note was generated in part with voice recognition software and I apologize for any typographical errors that were not detected and corrected.

## 2021-09-18 ENCOUNTER — Encounter
Admission: RE | Admit: 2021-09-18 | Discharge: 2021-09-18 | Disposition: A | Discharge status: Home / Self Care | Payer: BC Managed Care – PPO | Source: Ambulatory Visit | Attending: Obstetrics and Gynecology | Admitting: Obstetrics and Gynecology

## 2021-09-18 ENCOUNTER — Other Ambulatory Visit: Payer: Self-pay

## 2021-09-18 ENCOUNTER — Telehealth: Payer: BC Managed Care – PPO | Admitting: Physician Assistant

## 2021-09-18 DIAGNOSIS — J019 Acute sinusitis, unspecified: Secondary | ICD-10-CM

## 2021-09-18 DIAGNOSIS — B9689 Other specified bacterial agents as the cause of diseases classified elsewhere: Secondary | ICD-10-CM

## 2021-09-18 MED ORDER — AMOXICILLIN 500 MG PO CAPS: 500.0000 mg | ORAL_CAPSULE | Freq: Two times a day (BID) | ORAL | 0 refills | Status: AC

## 2021-09-18 NOTE — Patient Instructions (Signed)
Your procedure is scheduled on: 09/28/21 Report to [place]. To find out your arrival time please call [phone] between 1PM - 3PM on 09/25/21.  Remember: Instructions that are not followed completely may result in serious medical risk, up to and including death, or upon the discretion of your surgeon and anesthesiologist your surgery may need to be rescheduled.     _X__ 1. Do not eat food after midnight the night before your procedure.                 No gum chewing or hard candies. You may drink clear liquids up to 2 hours                 before you are scheduled to arrive for your surgery- DO not drink clear                 liquids within 2 hours of the start of your surgery.                 Clear Liquids include:  water, apple juice without pulp, clear carbohydrate                 drink such as Clearfast or Gatorade, Black Coffee or Tea (Do not add                 anything to coffee or tea). Diabetics water only  __X__2.  On the morning of surgery brush your teeth with toothpaste and water, you                 may rinse your mouth with mouthwash if you wish.  Do not swallow any              toothpaste of mouthwash.     _X__ 3.  No Alcohol for 24 hours before or after surgery.   _X__ 4.  Do Not Smoke or use e-cigarettes For 24 Hours Prior to Your Surgery.                 Do not use any chewable tobacco products for at least 6 hours prior to                 surgery.  ____  5.  Bring all medications with you on the day of surgery if instructed.   __X__  6.  Notify your doctor if there is any change in your medical condition      (cold, fever, infections).     Do not wear jewelry, make-up, hairpins, clips or nail polish. Do not wear lotions, powders, or perfumes.  Do not shave body hair 48 hours prior to surgery. Men may shave face and neck. Do not bring valuables to the hospital.    [facility] is not responsible for any  belongings or valuables.  Contacts, dentures/partials or body piercings may not be worn into surgery. Bring a case for your contacts, glasses or hearing aids, a denture cup will be supplied. Leave your suitcase in the car. After surgery it may be brought to your room. For patients admitted to the hospital, discharge time is determined by your treatment team.   Patients discharged the day of surgery will not be allowed to drive home.   Please read over the following fact sheets that you were given:   Incentive spirometer CHG soap  __X__ Take these medicines the morning of surgery with A SIP  OF WATER:    1. metoprolol succinate (TOPROL-XL) 50 MG 24 hr tablet  2. omeprazole (PRILOSEC) 20 MG capsule  3.   4.  5.  6.  ____ Fleet Enema (as directed)   __X__ Use CHG Soap/SAGE wipes as directed  ____ Use inhalers on the day of surgery  ____ Stop metformin/Janumet/Farxiga 2 days prior to surgery    ____ Take 1/2 of usual insulin dose the night before surgery. No insulin the morning          of surgery.   ____ Stop Blood Thinners Coumadin/Plavix/Xarelto/Pleta/Pradaxa/Eliquis/Effient/Aspirin  on   Or contact your Surgeon, Cardiologist or Medical Doctor regarding  ability to stop your blood thinners  __X__ Stop Anti-inflammatories 7 days before surgery such as Advil, Ibuprofen, Motrin,  BC or Goodies Powder, Naprosyn, Naproxen, Aleve, Aspirin    __X__ Stop all herbal supplements, fish oil or vitamin E until after surgery. Stop elderberry 09/21/21   ____ Bring C-Pap to the hospital.    How to Use an Incentive Spirometer An incentive spirometer is a tool that measures how well you are filling your lungs with each breath. Learning to take long, deep breaths using this tool can help you keep your lungs clear and active. This may help to reverse or lessen your chance of developing breathing (pulmonary) problems, especially infection. You may be asked to use a spirometer: After a  surgery. If you have a lung problem or a history of smoking. After a long period of time when you have been unable to move or be active. If the spirometer includes an indicator to show the highest number that you have reached, your health care provider or respiratory therapist will help you set a goal. Keep a log of your progress as told by your health care provider. What are the risks? Breathing too quickly may cause dizziness or cause you to pass out. Take your time so you do not get dizzy or light-headed. If you are in pain, you may need to take pain medicine before doing incentive spirometry. It is harder to take a deep breath if you are having pain. How to use your incentive spirometer  Sit up on the edge of your bed or on a chair. Hold the incentive spirometer so that it is in an upright position. Before you use the spirometer, breathe out normally. Place the mouthpiece in your mouth. Make sure your lips are closed tightly around it. Breathe in slowly and as deeply as you can through your mouth, causing the piston or the ball to rise toward the top of the chamber. Hold your breath for 3-5 seconds, or for as long as possible. If the spirometer includes a coach indicator, use this to guide you in breathing. Slow down your breathing if the indicator goes above the marked areas. Remove the mouthpiece from your mouth and breathe out normally. The piston or ball will return to the bottom of the chamber. Rest for a few seconds, then repeat the steps 10 or more times. Take your time and take a few normal breaths between deep breaths so that you do not get dizzy or light-headed. Do this every 1-2 hours when you are awake. If the spirometer includes a goal marker to show the highest number you have reached (best effort), use this as a goal to work toward during each repetition. After each set of 10 deep breaths, cough a few times. This will help to make sure that your lungs are clear. If you  have  an incision on your chest or abdomen from surgery, place a pillow or a rolled-up towel firmly against the incision when you cough. This can help to reduce pain while taking deep breaths and coughing. General tips When you are able to get out of bed: Walk around often. Continue to take deep breaths and cough in order to clear your lungs. Keep using the incentive spirometer until your health care provider says it is okay to stop using it. If you have been in the hospital, you may be told to keep using the spirometer at home. Contact a health care provider if: You are having difficulty using the spirometer. You have trouble using the spirometer as often as instructed. Your pain medicine is not giving enough relief for you to use the spirometer as told. You have a fever. Get help right away if: You develop shortness of breath. You develop a cough with bloody mucus from the lungs. You have fluid or blood coming from an incision site after you cough. Summary An incentive spirometer is a tool that can help you learn to take long, deep breaths to keep your lungs clear and active. You may be asked to use a spirometer after a surgery, if you have a lung problem or a history of smoking, or if you have been inactive for a long period of time. Use your incentive spirometer as instructed every 1-2 hours while you are awake. If you have an incision on your chest or abdomen, place a pillow or a rolled-up towel firmly against your incision when you cough. This will help to reduce pain. Get help right away if you have shortness of breath, you cough up bloody mucus, or blood comes from your incision when you cough. This information is not intended to replace advice given to you by your health care provider. Make sure you discuss any questions you have with your health care provider. Document Revised: 02/18/2020 Document Reviewed: 02/18/2020 Elsevier Patient Education  [place].

## 2021-09-18 NOTE — Progress Notes (Signed)
E-Visit for Sinus Problems  We are sorry that you are not feeling well.  Here is how we plan to help!  Based on what you have shared with me it looks like you have sinusitis.  Sinusitis is inflammation and infection in the sinus cavities of the head.  Based on your presentation I believe you most likely have Acute Bacterial Sinusitis.  This is an infection caused by bacteria and is treated with antibiotics. I have prescribed Amoxicillin 537m take one tablet twice daily for 10 days. You may use an oral decongestant such as Mucinex D or if you have glaucoma or high blood pressure use plain Mucinex. Saline nasal spray help and can safely be used as often as needed for congestion.  If you develop worsening sinus pain, fever or notice severe headache and vision changes, or if symptoms are not better after completion of antibiotic, please schedule an appointment with a health care provider.    Sinus infections are not as easily transmitted as other respiratory infection, however we still recommend that you avoid close contact with loved ones, especially the very young and elderly.  Remember to wash your hands thoroughly throughout the day as this is the number one way to prevent the spread of infection!  Home Care: Only take medications as instructed by your medical team. Complete the entire course of an antibiotic. Do not take these medications with alcohol. A steam or ultrasonic humidifier can help congestion.  You can place a towel over your head and breathe in the steam from hot water coming from a faucet. Avoid close contacts especially the very young and the elderly. Cover your mouth when you cough or sneeze. Always remember to wash your hands.  Get Help Right Away If: You develop worsening fever or sinus pain. You develop a severe head ache or visual changes. Your symptoms persist after you have completed your treatment plan.  Make sure you Understand these instructions. Will watch your  condition. Will get help right away if you are not doing well or get worse.  Thank you for choosing an e-visit.  Your e-visit answers were reviewed by a board certified advanced clinical practitioner to complete your personal care plan. Depending upon the condition, your plan could have included both over the counter or prescription medications.  Please review your pharmacy choice. Make sure the pharmacy is open so you can pick up prescription now. If there is a problem, you may contact your provider through [employer] have the prescription routed to another pharmacy.  Your safety is important to [place] If you have drug allergies check your prescription carefully.   For the next 24 hours you can use MyChart to ask questions about today's visit, request a non-urgent call back, or ask for a work or school excuse. You will get an email in the next two days asking about your experience. I hope that your e-visit has been valuable and will speed your recovery.  I provided 5 minutes of non face-to-face time during this encounter for chart review and documentation.

## 2021-09-21 ENCOUNTER — Other Ambulatory Visit: Payer: Self-pay

## 2021-09-21 ENCOUNTER — Encounter
Admission: RE | Admit: 2021-09-21 | Discharge: 2021-09-21 | Disposition: A | Discharge status: Home / Self Care | Payer: BC Managed Care – PPO | Source: Ambulatory Visit | Attending: Obstetrics and Gynecology | Admitting: Obstetrics and Gynecology

## 2021-09-21 DIAGNOSIS — Z01818 Encounter for other preprocedural examination: Secondary | ICD-10-CM | POA: Diagnosis not present

## 2021-09-21 DIAGNOSIS — I10 Essential (primary) hypertension: Secondary | ICD-10-CM | POA: Insufficient documentation

## 2021-09-21 LAB — BASIC METABOLIC PANEL
Anion gap: 8 (ref 5–15)
BUN: 11 mg/dL (ref 6–20)
CO2: 30 mmol/L (ref 22–32)
Calcium: 9 mg/dL (ref 8.9–10.3)
Chloride: 100 mmol/L (ref 98–111)
Creatinine, Ser: 0.8 mg/dL (ref 0.44–1.00)
GFR, Estimated: >60 mL/min (ref >60)
Glucose, Bld: 92 mg/dL (ref 70–99)
Potassium: 3 mmol/L — ABNORMAL LOW (ref 3.5–5.1)
Sodium: 138 mmol/L (ref 135–145)

## 2021-09-21 LAB — CBC
HCT: 38.6 % (ref 36.0–46.0)
Hemoglobin: 12.5 g/dL (ref 12.0–15.0)
MCH: 28.1 pg (ref 26.0–34.0)
MCHC: 32.4 g/dL (ref 30.0–36.0)
MCV: 86.7 fL (ref 80.0–100.0)
Platelets: 343 10*3/uL (ref 150–400)
RBC: 4.45 MIL/uL (ref 3.87–5.11)
RDW: 13.3 % (ref 11.5–15.5)
WBC: 9.3 10*3/uL (ref 4.0–10.5)
nRBC: 0 % (ref 0.0–0.2)

## 2021-09-21 LAB — TYPE AND SCREEN
ABO/RH(D): O POS
Antibody Screen: NEGATIVE

## 2021-09-21 NOTE — Progress Notes (Signed)
  [facility] Perioperative Services: Pre-Admission/Anesthesia Testing  Abnormal Lab Notification   Date: 09/21/21  Name: Rachel Wells MRN:   [id number]  Re: Abnormal labs noted during PAT appointment   Provider(s) Notified: Benjaman Kindler, MD Notification mode: Routed and/or faxed via [place] LAB VALUE(S): Lab Results  Component Value Date   K 3.0 (L) 09/21/2021   Notes:  Patient is scheduled for a TOTAL LAPAROSCOPIC HYSTERECTOMY WITH BILATERAL SALPINGECTOMY (Bilateral) on 09/28/2021. L+ low on PAT labs as per above.  In review of her EMR, it is noted the patient is on daily thiazide diuretic therapy (HCTZ 12.5 mg).  She is not on any type of oral K+ supplementation.  We will send result to primary attending surgeon for review and optimization as the patient's K+ level is at the current minimum allow by anesthesia to proceed with elective/nonemergent cases.  If the patient drops any lower, her procedure will unfortunately need to be postponed pending optimization.  Order placed to have K+ rechecked on the day of her procedure to ensure that she is at a safe level to proceed with the planned surgical intervention.    This is a Community education officer; no formal response is required.  Honor Loh, MSN, APRN, FNP-C, CEN [facility]  Peri-operative Services Nurse Practitioner Phone: [phone] Fax: [phone] 09/21/21 12:28 PM

## 2021-09-27 MED ORDER — LACTATED RINGERS IV SOLN: INTRAVENOUS | Status: DC

## 2021-09-27 MED ORDER — CHLORHEXIDINE GLUCONATE 0.12 % MT SOLN
15.0000 mL | Freq: Once | OROMUCOSAL | Status: AC
  Administered 2021-09-28: 15 mL via OROMUCOSAL

## 2021-09-27 MED ORDER — CEFAZOLIN SODIUM-DEXTROSE 2-4 GM/100ML-% IV SOLN
2.0000 g | INTRAVENOUS | Status: AC
  Administered 2021-09-28: 2 g via INTRAVENOUS

## 2021-09-27 MED ORDER — ACETAMINOPHEN 500 MG PO TABS
1000.0000 mg | ORAL_TABLET | ORAL | Status: AC
  Administered 2021-09-28: 1000 mg via ORAL

## 2021-09-27 MED ORDER — GABAPENTIN 300 MG PO CAPS
300.0000 mg | ORAL_CAPSULE | ORAL | Status: AC
  Administered 2021-09-28: 300 mg via ORAL

## 2021-09-27 MED ORDER — ORAL CARE MOUTH RINSE: 15.0000 mL | Freq: Once | OROMUCOSAL | Status: AC

## 2021-09-28 ENCOUNTER — Encounter: Payer: Self-pay | Admitting: Obstetrics and Gynecology

## 2021-09-28 ENCOUNTER — Other Ambulatory Visit: Payer: Self-pay

## 2021-09-28 ENCOUNTER — Ambulatory Visit
Admission: RE | Admit: 2021-09-28 | Discharge: 2021-09-28 | Disposition: A | Discharge status: Home / Self Care | Payer: BC Managed Care – PPO | Attending: Obstetrics and Gynecology | Admitting: Obstetrics and Gynecology

## 2021-09-28 ENCOUNTER — Ambulatory Visit: Payer: BC Managed Care – PPO | Admitting: Anesthesiology

## 2021-09-28 ENCOUNTER — Encounter
Admission: RE | Disposition: A | Discharge status: Home / Self Care | Payer: Self-pay | Source: Home / Self Care | Attending: Obstetrics and Gynecology

## 2021-09-28 ENCOUNTER — Ambulatory Visit: Payer: BC Managed Care – PPO | Admitting: Urgent Care

## 2021-09-28 DIAGNOSIS — I10 Essential (primary) hypertension: Secondary | ICD-10-CM | POA: Insufficient documentation

## 2021-09-28 DIAGNOSIS — Z91013 Allergy to seafood: Secondary | ICD-10-CM | POA: Insufficient documentation

## 2021-09-28 DIAGNOSIS — N838 Other noninflammatory disorders of ovary, fallopian tube and broad ligament: Secondary | ICD-10-CM | POA: Diagnosis not present

## 2021-09-28 DIAGNOSIS — D259 Leiomyoma of uterus, unspecified: Secondary | ICD-10-CM | POA: Insufficient documentation

## 2021-09-28 DIAGNOSIS — K219 Gastro-esophageal reflux disease without esophagitis: Secondary | ICD-10-CM | POA: Diagnosis not present

## 2021-09-28 DIAGNOSIS — N920 Excessive and frequent menstruation with regular cycle: Secondary | ICD-10-CM | POA: Insufficient documentation

## 2021-09-28 DIAGNOSIS — Z793 Long term (current) use of hormonal contraceptives: Secondary | ICD-10-CM | POA: Insufficient documentation

## 2021-09-28 DIAGNOSIS — Z79899 Other long term (current) drug therapy: Secondary | ICD-10-CM | POA: Insufficient documentation

## 2021-09-28 DIAGNOSIS — Z881 Allergy status to other antibiotic agents status: Secondary | ICD-10-CM | POA: Insufficient documentation

## 2021-09-28 DIAGNOSIS — N8003 Adenomyosis of the uterus: Secondary | ICD-10-CM | POA: Insufficient documentation

## 2021-09-28 DIAGNOSIS — N946 Dysmenorrhea, unspecified: Secondary | ICD-10-CM | POA: Diagnosis not present

## 2021-09-28 HISTORY — PX: IUD REMOVAL: SHX5392

## 2021-09-28 HISTORY — PX: TOTAL LAPAROSCOPIC HYSTERECTOMY WITH SALPINGECTOMY: SHX6742

## 2021-09-28 LAB — POCT I-STAT, CHEM 8
BUN: 13 mg/dL (ref 6–20)
Calcium, Ion: 1.15 mmol/L (ref 1.15–1.40)
Chloride: 103 mmol/L (ref 98–111)
Creatinine, Ser: 0.8 mg/dL (ref 0.44–1.00)
Glucose, Bld: 102 mg/dL — ABNORMAL HIGH (ref 70–99)
HCT: 35 % — ABNORMAL LOW (ref 36.0–46.0)
Hemoglobin: 11.9 g/dL — ABNORMAL LOW (ref 12.0–15.0)
Potassium: 2.9 mmol/L — ABNORMAL LOW (ref 3.5–5.1)
Sodium: 139 mmol/L (ref 135–145)
TCO2: 25 mmol/L (ref 22–32)

## 2021-09-28 LAB — POCT PREGNANCY, URINE: Preg Test, Ur: NEGATIVE

## 2021-09-28 LAB — ABO/RH: ABO/RH(D): O POS

## 2021-09-28 SURGERY — HYSTERECTOMY, TOTAL, LAPAROSCOPIC, WITH SALPINGECTOMY
Anesthesia: General

## 2021-09-28 MED ORDER — CEFAZOLIN SODIUM-DEXTROSE 2-4 GM/100ML-% IV SOLN
INTRAVENOUS | Status: AC
  Filled 2021-09-28: qty 100

## 2021-09-28 MED ORDER — MIDAZOLAM HCL 2 MG/2ML IJ SOLN
INTRAMUSCULAR | Status: AC
  Filled 2021-09-28: qty 2

## 2021-09-28 MED ORDER — BUPIVACAINE HCL (PF) 0.5 % IJ SOLN
INTRAMUSCULAR | Status: AC
  Filled 2021-09-28: qty 30

## 2021-09-28 MED ORDER — ONDANSETRON HCL 4 MG/2ML IJ SOLN
INTRAMUSCULAR | Status: DC | PRN
  Administered 2021-09-28: 4 mg via INTRAVENOUS

## 2021-09-28 MED ORDER — GABAPENTIN 800 MG PO TABS: 800.0000 mg | ORAL_TABLET | Freq: Every day | ORAL | 0 refills | Status: DC

## 2021-09-28 MED ORDER — PROPOFOL 10 MG/ML IV BOLUS
INTRAVENOUS | Status: DC | PRN
  Administered 2021-09-28: 170 mg via INTRAVENOUS

## 2021-09-28 MED ORDER — ROCURONIUM BROMIDE 100 MG/10ML IV SOLN
INTRAVENOUS | Status: DC | PRN
  Administered 2021-09-28: 20 mg via INTRAVENOUS
  Administered 2021-09-28: 60 mg via INTRAVENOUS

## 2021-09-28 MED ORDER — GABAPENTIN 300 MG PO CAPS
ORAL_CAPSULE | ORAL | Status: AC
  Filled 2021-09-28: qty 1

## 2021-09-28 MED ORDER — FENTANYL CITRATE (PF) 100 MCG/2ML IJ SOLN
25.0000 ug | INTRAMUSCULAR | Status: DC | PRN
  Administered 2021-09-28 (×3): 25 ug via INTRAVENOUS

## 2021-09-28 MED ORDER — OXYCODONE HCL 5 MG PO TABS: 5.0000 mg | ORAL_TABLET | Freq: Once | ORAL | Status: AC | PRN

## 2021-09-28 MED ORDER — ACETAMINOPHEN 500 MG PO TABS
ORAL_TABLET | ORAL | Status: AC
  Filled 2021-09-28: qty 2

## 2021-09-28 MED ORDER — DEXAMETHASONE SODIUM PHOSPHATE 10 MG/ML IJ SOLN
INTRAMUSCULAR | Status: DC | PRN
  Administered 2021-09-28: 10 mg via INTRAVENOUS

## 2021-09-28 MED ORDER — SUGAMMADEX SODIUM 200 MG/2ML IV SOLN
INTRAVENOUS | Status: DC | PRN
  Administered 2021-09-28: 200 mg via INTRAVENOUS

## 2021-09-28 MED ORDER — DEXMEDETOMIDINE HCL IN NACL 200 MCG/50ML IV SOLN
INTRAVENOUS | Status: AC
  Filled 2021-09-28: qty 50

## 2021-09-28 MED ORDER — LIDOCAINE HCL (CARDIAC) PF 100 MG/5ML IV SOSY
PREFILLED_SYRINGE | INTRAVENOUS | Status: DC | PRN
  Administered 2021-09-28: 100 mg via INTRAVENOUS

## 2021-09-28 MED ORDER — MIDAZOLAM HCL 2 MG/2ML IJ SOLN
INTRAMUSCULAR | Status: DC | PRN
  Administered 2021-09-28: 2 mg via INTRAVENOUS

## 2021-09-28 MED ORDER — BUPIVACAINE HCL 0.5 % IJ SOLN
INTRAMUSCULAR | Status: DC | PRN
  Administered 2021-09-28: 10 mL

## 2021-09-28 MED ORDER — ONDANSETRON HCL 4 MG/2ML IJ SOLN
4.0000 mg | Freq: Once | INTRAMUSCULAR | Status: AC
  Administered 2021-09-28: 4 mg via INTRAVENOUS

## 2021-09-28 MED ORDER — PROPOFOL 10 MG/ML IV BOLUS
INTRAVENOUS | Status: AC
  Filled 2021-09-28: qty 20

## 2021-09-28 MED ORDER — FENTANYL CITRATE (PF) 100 MCG/2ML IJ SOLN
INTRAMUSCULAR | Status: DC | PRN
  Administered 2021-09-28: 25 ug via INTRAVENOUS
  Administered 2021-09-28: 50 ug via INTRAVENOUS
  Administered 2021-09-28: 25 ug via INTRAVENOUS

## 2021-09-28 MED ORDER — ACETAMINOPHEN 500 MG PO TABS: 1000.0000 mg | ORAL_TABLET | Freq: Four times a day (QID) | ORAL | 0 refills | Status: AC

## 2021-09-28 MED ORDER — OXYCODONE HCL 5 MG/5ML PO SOLN: 5.0000 mg | Freq: Once | ORAL | Status: AC | PRN

## 2021-09-28 MED ORDER — SODIUM CHLORIDE 0.9 % IR SOLN
Status: DC | PRN
  Administered 2021-09-28: 1000 mL

## 2021-09-28 MED ORDER — HEMOSTATIC AGENTS (NO CHARGE) OPTIME
TOPICAL | Status: DC | PRN
  Administered 2021-09-28: 1 via TOPICAL

## 2021-09-28 MED ORDER — FENTANYL CITRATE (PF) 100 MCG/2ML IJ SOLN
INTRAMUSCULAR | Status: AC
  Administered 2021-09-28: 25 ug via INTRAVENOUS
  Filled 2021-09-28: qty 2

## 2021-09-28 MED ORDER — FENTANYL CITRATE (PF) 100 MCG/2ML IJ SOLN
INTRAMUSCULAR | Status: AC
  Filled 2021-09-28: qty 2

## 2021-09-28 MED ORDER — DOCUSATE SODIUM 100 MG PO CAPS: 100.0000 mg | ORAL_CAPSULE | Freq: Two times a day (BID) | ORAL | 0 refills | Status: DC

## 2021-09-28 MED ORDER — OXYCODONE HCL 5 MG PO TABS: 5.0000 mg | ORAL_TABLET | ORAL | 0 refills | Status: DC | PRN

## 2021-09-28 MED ORDER — DEXMEDETOMIDINE HCL IN NACL 200 MCG/50ML IV SOLN
INTRAVENOUS | Status: DC | PRN
  Administered 2021-09-28 (×2): 4 ug via INTRAVENOUS

## 2021-09-28 MED ORDER — OXYCODONE HCL 5 MG PO TABS
ORAL_TABLET | ORAL | Status: AC
  Administered 2021-09-28: 5 mg via ORAL
  Filled 2021-09-28: qty 1

## 2021-09-28 MED ORDER — ONDANSETRON HCL 4 MG/2ML IJ SOLN
INTRAMUSCULAR | Status: AC
  Filled 2021-09-28: qty 2

## 2021-09-28 MED ORDER — IBUPROFEN 800 MG PO TABS: 800.0000 mg | ORAL_TABLET | Freq: Three times a day (TID) | ORAL | 1 refills | Status: AC

## 2021-09-28 MED ORDER — CHLORHEXIDINE GLUCONATE 0.12 % MT SOLN
OROMUCOSAL | Status: AC
  Filled 2021-09-28: qty 15

## 2021-09-28 SURGICAL SUPPLY — 67 items
ADH SKN CLS APL DERMABOND .7 (GAUZE/BANDAGES/DRESSINGS) ×3
APL PRP STRL LF DISP 70% ISPRP (MISCELLANEOUS) ×3
APL SRG 38 LTWT LNG FL B (MISCELLANEOUS) ×3
APPLICATOR ARISTA FLEXITIP XL (MISCELLANEOUS) ×4 IMPLANT
BAG DRN RND TRDRP ANRFLXCHMBR (UROLOGICAL SUPPLIES) ×3
BAG URINE DRAIN 2000ML AR STRL (UROLOGICAL SUPPLIES) ×4 IMPLANT
BLADE SURG SZ11 CARB STEEL (BLADE) ×4 IMPLANT
CATH FOLEY 2WAY  5CC 16FR (CATHETERS) ×4
CATH FOLEY 2WAY 5CC 16FR (CATHETERS) ×3
CATH URTH 16FR FL 2W BLN LF (CATHETERS) ×3 IMPLANT
CHLORAPREP W/TINT 26 (MISCELLANEOUS) ×4 IMPLANT
CORD MONOPOLAR M/FML 12FT (MISCELLANEOUS) ×4 IMPLANT
COUNTER NEEDLE 20/40 LG (NEEDLE) ×4 IMPLANT
COVER LIGHT HANDLE STERIS (MISCELLANEOUS) ×8 IMPLANT
DERMABOND ADVANCED (GAUZE/BANDAGES/DRESSINGS) ×1
DERMABOND ADVANCED .7 DNX12 (GAUZE/BANDAGES/DRESSINGS) ×3 IMPLANT
DEVICE SUTURE ENDOST 10MM (ENDOMECHANICALS) IMPLANT
DRAPE GENERAL ENDO 106X123.5 (DRAPES) ×4 IMPLANT
DRSG TEGADERM 2-3/8X2-3/4 SM (GAUZE/BANDAGES/DRESSINGS) ×12 IMPLANT
GAUZE 4X4 16PLY ~~LOC~~+RFID DBL (SPONGE) ×12 IMPLANT
GLOVE SURG ENC MOIS LTX SZ7 (GLOVE) ×12 IMPLANT
GLOVE SURG UNDER LTX SZ7.5 (GLOVE) ×8 IMPLANT
GOWN STRL REUS W/ TWL LRG LVL3 (GOWN DISPOSABLE) ×9 IMPLANT
GOWN STRL REUS W/ TWL XL LVL3 (GOWN DISPOSABLE) ×3 IMPLANT
GOWN STRL REUS W/TWL LRG LVL3 (GOWN DISPOSABLE) ×12
GOWN STRL REUS W/TWL XL LVL3 (GOWN DISPOSABLE) ×4
GRASPER SUT TROCAR 14GX15 (MISCELLANEOUS) ×4 IMPLANT
HANDLE YANKAUER SUCT BULB TIP (MISCELLANEOUS) ×4 IMPLANT
HEMOSTAT ARISTA ABSORB 3G PWDR (HEMOSTASIS) ×4 IMPLANT
IRRIGATION STRYKERFLOW (MISCELLANEOUS) ×3 IMPLANT
IRRIGATOR STRYKERFLOW (MISCELLANEOUS) ×4
IV NS 1000ML (IV SOLUTION) ×4
IV NS 1000ML BAXH (IV SOLUTION) ×3 IMPLANT
KIT PINK PAD W/HEAD ARE REST (MISCELLANEOUS) ×4
KIT PINK PAD W/HEAD ARM REST (MISCELLANEOUS) ×3 IMPLANT
KIT TURNOVER CYSTO (KITS) ×4 IMPLANT
L-HOOK LAP DISP 36CM (ELECTROSURGICAL)
LABEL OR SOLS (LABEL) ×4 IMPLANT
LHOOK LAP DISP 36CM (ELECTROSURGICAL) IMPLANT
LIGASURE VESSEL 5MM BLUNT TIP (ELECTROSURGICAL) ×4 IMPLANT
MANIFOLD NEPTUNE II (INSTRUMENTS) ×4 IMPLANT
MANIPULATOR VCARE LG CRV RETR (MISCELLANEOUS) IMPLANT
MANIPULATOR VCARE SML CRV RETR (MISCELLANEOUS) IMPLANT
MANIPULATOR VCARE STD CRV RETR (MISCELLANEOUS) ×4 IMPLANT
NS IRRIG 500ML POUR BTL (IV SOLUTION) IMPLANT
OCCLUDER COLPOPNEUMO (BALLOONS) ×4 IMPLANT
PACK GYN LAPAROSCOPIC (MISCELLANEOUS) ×4 IMPLANT
PAD OB MATERNITY 4.3X12.25 (PERSONAL CARE ITEMS) ×4 IMPLANT
PAD PREP 24X41 OB/GYN DISP (PERSONAL CARE ITEMS) ×4 IMPLANT
PENCIL ELECTRO HAND CTR (MISCELLANEOUS) IMPLANT
SCISSORS METZENBAUM CVD 33 (INSTRUMENTS) IMPLANT
SCRUB EXIDINE 4% CHG 4OZ (MISCELLANEOUS) ×4 IMPLANT
SET CYSTO W/LG BORE CLAMP LF (SET/KITS/TRAYS/PACK) IMPLANT
SLEEVE ENDOPATH XCEL 5M (ENDOMECHANICALS) ×8 IMPLANT
SOL PREP PROV IODINE SCRUB 4OZ (MISCELLANEOUS) IMPLANT
SPONGE GAUZE 2X2 8PLY STRL LF (GAUZE/BANDAGES/DRESSINGS) ×8 IMPLANT
SUT ENDO VLOC 180-0-8IN (SUTURE) IMPLANT
SUT MNCRL 4-0 (SUTURE) ×4
SUT MNCRL 4-0 27XMFL (SUTURE) ×3
SUT VIC AB 0 CT1 36 (SUTURE) ×8 IMPLANT
SUTURE MNCRL 4-0 27XMF (SUTURE) ×3 IMPLANT
SYR 10ML LL (SYRINGE) ×4 IMPLANT
SYR 50ML LL SCALE MARK (SYRINGE) ×4 IMPLANT
TROCAR ENDO BLADELESS 11MM (ENDOMECHANICALS) IMPLANT
TROCAR XCEL NON-BLD 5MMX100MML (ENDOMECHANICALS) ×4 IMPLANT
TUBING EVAC SMOKE HEATED PNEUM (TUBING) ×4 IMPLANT
WATER STERILE IRR 500ML POUR (IV SOLUTION) ×4 IMPLANT

## 2021-09-28 NOTE — Discharge Instructions (Addendum)
Discharge instructions after   total laparoscopic hysterectomy   For the next three days, take ibuprofen and acetaminophen on a schedule, every 8 hours. You can take them together or you can intersperse them, and take one every four hours. I also gave you gabapentin for nighttime, to help you sleep and also to control pain. Take gabapentin medicines at night for at least the next 3 nights. You also have a narcotic, oxycodone, to take as needed if the above medicines don't help.  Postop constipation is a major cause of pain. Stay well hydrated, walk as you tolerate, and take over the counter senna as well as stool softeners if you need them.    Signs and Symptoms to Report Call our office at [phone] if you have any of the following.   Fever over 100.4 degrees or higher  Severe stomach pain not relieved with pain medications  Bright red bleeding that's heavier than a period that does not slow with rest  To go the bathroom a lot (frequency), you can't hold your urine (urgency), or it hurts when you empty your bladder (urinate)  Chest pain  Shortness of breath  Pain in the calves of your legs  Severe nausea and vomiting not relieved with anti-nausea medications  Signs of infection around your wounds, such as redness, hot to touch, swelling, green/yellow drainage (like pus), bad smelling discharge  Any concerns  What You Can Expect after Surgery  You may see some pink tinged, bloody fluid and bruising around the wound. This is normal.  You may notice shoulder and neck pain. This is caused by the gas used during surgery to expand your abdomen so your surgeon could get to the uterus easier.  You may have a sore throat because of the tube in your mouth during general anesthesia. This will go away in 2 to 3 days.  You may have some stomach cramps.  You may notice spotting on your panties.  You may have pain around the incision sites.   Activities after Your Discharge Follow these  guidelines to help speed your recovery at home:  Do the coughing and deep breathing as you did in the hospital for 2 weeks. Use the small blue breathing device, called the incentive spirometer for 2 weeks.  Don't drive if you are in pain or taking narcotic pain medicine. You may drive when you can safely slam on the brakes, turn the wheel forcefully, and rotate your torso comfortably. This is typically 1-2 weeks. Practice in a parking lot or side street prior to attempting to drive regularly.   Ask others to help with household chores for 4 weeks.  Do not lift anything heavier that 10 pounds for 4-6 weeks. This includes pets, children, and groceries.  Don't do strenuous activities, exercises, or sports like vacuuming, tennis, squash, etc. until your doctor says it is safe to do so. ---Maintain pelvic rest for 8 weeks. This means nothing in the vagina or rectum at all (no douching, tampons, intercourse) for 8 weeks.   Walk as you feel able. Rest often since it may take two or three weeks for your energy level to return to normal.   You may climb stairs  Avoid constipation:   -Eat fruits, vegetables, and whole grains. Eat small meals as your appetite will take time to return to normal.   -Drink 6 to 8 glasses of water each day unless your doctor has told you to limit your fluids.   -Use a laxative  or stool softener as needed if constipation becomes a problem. You may take Miralax, metamucil, Citrucil, Colace, Senekot, FiberCon, etc. If this does not relieve the constipation, try two tablespoons of Milk Of Magnesia every 8 hours until your bowels move.   You may shower. Gently wash the wounds with a mild soap and water. Pat dry.  Do not get in a hot tub, swimming pool, etc. for 6 weeks.  Do not use lotions, oils, powders on the wounds.  Do not douche, use tampons, or have sex until your doctor says it is okay.  Take your pain medicine when you need it. The medicine may not work as well if the pain is  bad.  Take the medicines you were taking before surgery. Other medications you will need are pain medications and possibly constipation and nausea medications (Zofran).  AMBULATORY SURGERY  DISCHARGE INSTRUCTIONS   The drugs that you were given will stay in your system until tomorrow so for the next 24 hours you should not:  Drive an automobile Make any legal decisions Drink any alcoholic beverage   You may resume regular meals tomorrow.  Today it is better to start with liquids and gradually work up to solid foods.  You may eat anything you prefer, but it is better to start with liquids, then soup and crackers, and gradually work up to solid foods.   Please notify your doctor immediately if you have any unusual bleeding, trouble breathing, redness and pain at the surgery site, drainage, fever, or pain not relieved by medication.    Additional Instructions:        Please contact your physician with any problems or Same Day Surgery at [phone], Monday through Friday 6 am to 4 pm, or [place] at [facility] number at [phone].

## 2021-09-28 NOTE — Transfer of Care (Signed)
Immediate Anesthesia Transfer of Care Note  Patient: Seleni Meller  Procedure(s) Performed: TOTAL LAPAROSCOPIC HYSTERECTOMY WITH BILATERAL SALPINGECTOMY (Bilateral) INTRAUTERINE DEVICE (IUD) REMOVAL LAPAROSCOPIC OOPHORECTOMY (Left)  Patient Location: PACU  Anesthesia Type:General  Level of Consciousness: drowsy  Airway & Oxygen Therapy: Patient Spontanous Breathing and Patient connected to face mask oxygen  Post-op Assessment: Report given to RN and Post -op Vital signs reviewed and stable  Post vital signs: Reviewed and stable  Last Vitals:  Vitals Value Taken Time  BP 135/84 09/28/21 1015  Temp 36.3 C 09/28/21 1011  Pulse 80 09/28/21 1017  Resp 16 09/28/21 1017  SpO2 97 % 09/28/21 1017  Vitals shown include unvalidated device data.  Last Pain:  Vitals:   09/28/21 1011  TempSrc:   PainSc: Asleep         Complications: No notable events documented.

## 2021-09-28 NOTE — Interval H&P Note (Signed)
History and Physical Interval Note:  09/28/2021 7:25 AM  Rachel Wells  has presented today for surgery, with the diagnosis of fibroids, excessive menstruation, dysmenorrhea.  The various methods of treatment have been discussed with the patient and family. After consideration of risks, benefits and other options for treatment, the patient has consented to  Procedure(s): TOTAL LAPAROSCOPIC HYSTERECTOMY WITH BILATERAL SALPINGECTOMY (Bilateral) as a surgical intervention.  The patient's history has been reviewed, patient examined, no change in status, stable for surgery.  I have reviewed the patient's chart and labs.  Questions were answered to the patient's satisfaction.     Benjaman Kindler

## 2021-09-28 NOTE — Anesthesia Preprocedure Evaluation (Signed)
Anesthesia Evaluation  Patient identified by MRN, date of birth, ID band Patient awake    Reviewed: Allergy & Precautions, NPO status , Patient's Chart, lab work & pertinent test results  History of Anesthesia Complications Negative for: history of anesthetic complications  Airway Mallampati: III  TM Distance: >3 FB Neck ROM: full    Dental  (+) Chipped   Pulmonary neg pulmonary ROS, neg shortness of breath, neg recent URI,    Pulmonary exam normal        Cardiovascular Exercise Tolerance: Good hypertension, (-) angina(-) Past MI and (-) DOE Normal cardiovascular exam     Neuro/Psych  Headaches, negative psych ROS   GI/Hepatic Neg liver ROS, GERD  Medicated and Controlled,  Endo/Other  negative endocrine ROS  Renal/GU      Musculoskeletal   Abdominal   Peds  Hematology negative hematology ROS (+)   Anesthesia Other Findings Past Medical History: 03/22/2008: BP (high blood pressure) 04/18/2015: Fatigue 04/18/2015: Fibroid 12/03/2016: Gastroesophageal reflux disease 03/22/2008: Headache, migraine No date: Hypertension  Past Surgical History: 1992: DILATION AND CURETTAGE OF UTERUS No date: TONSILLECTOMY AND ADENOIDECTOMY     Reproductive/Obstetrics negative OB ROS                             Anesthesia Physical Anesthesia Plan  ASA: 3  Anesthesia Plan: General ETT   Post-op Pain Management:    Induction: Intravenous  PONV Risk Score and Plan: Ondansetron, Dexamethasone, Midazolam and Treatment may vary due to age or medical condition  Airway Management Planned: Oral ETT  Additional Equipment:   Intra-op Plan:   Post-operative Plan: Extubation in OR  Informed Consent: I have reviewed the patients History and Physical, chart, labs and discussed the procedure including the risks, benefits and alternatives for the proposed anesthesia with the patient or authorized  representative who has indicated his/her understanding and acceptance.     Dental Advisory Given  Plan Discussed with: Anesthesiologist, CRNA and Surgeon  Anesthesia Plan Comments: (Patient consented for risks of anesthesia including but not limited to:  - adverse reactions to medications - damage to eyes, teeth, lips or other oral mucosa - nerve damage due to positioning  - sore throat or hoarseness - Damage to heart, brain, nerves, lungs, other parts of body or loss of life  Patient voiced understanding.)        Anesthesia Quick Evaluation

## 2021-09-28 NOTE — Anesthesia Procedure Notes (Addendum)
Procedure Name: Intubation Date/Time: 09/28/2021 7:40 AM Performed by: Doreen Salvage, CRNA Pre-anesthesia Checklist: Patient identified, Patient being monitored, Timeout performed, Emergency Drugs available and Suction available Patient Re-evaluated:Patient Re-evaluated prior to induction Oxygen Delivery Method: Circle system utilized Preoxygenation: Pre-oxygenation with 100% oxygen Induction Type: IV induction Ventilation: Mask ventilation without difficulty Laryngoscope Size: Mac, 3 and McGraph Grade View: Grade I Tube type: Oral Tube size: 7.0 mm Number of attempts: 1 Airway Equipment and Method: Stylet Placement Confirmation: ETT inserted through vocal cords under direct vision, positive ETCO2 and breath sounds checked- equal and bilateral Secured at: 20 cm Tube secured with: Tape Dental Injury: Teeth and Oropharynx as per pre-operative assessment  Difficulty Due To: Difficult Airway- due to anterior larynx

## 2021-09-28 NOTE — Anesthesia Postprocedure Evaluation (Signed)
Anesthesia Post Note  Patient: Rachel Wells  Procedure(s) Performed: TOTAL LAPAROSCOPIC HYSTERECTOMY WITH BILATERAL SALPINGECTOMY (Bilateral) INTRAUTERINE DEVICE (IUD) REMOVAL LAPAROSCOPIC OOPHORECTOMY (Left)  Patient location during evaluation: PACU Anesthesia Type: General Level of consciousness: awake and alert, oriented and patient cooperative Pain management: pain level controlled Vital Signs Assessment: post-procedure vital signs reviewed and stable Respiratory status: spontaneous breathing, nonlabored ventilation and respiratory function stable Cardiovascular status: blood pressure returned to baseline and stable Postop Assessment: adequate PO intake Anesthetic complications: no   No notable events documented.   Last Vitals:  Vitals:   09/28/21 1030 09/28/21 1045  BP: (!) 150/84 (!) 146/89  Pulse: 87 84  Resp: 18 14  Temp:    SpO2: 100% 96%    Last Pain:  Vitals:   09/28/21 1111  TempSrc:   PainSc: Asleep                 Darrin Nipper

## 2021-09-28 NOTE — Progress Notes (Signed)
Potassium checked upon arrival 2.9. Anesthesia aware stating okay to proceed with surgery.

## 2021-09-28 NOTE — Op Note (Signed)
Rachel Wells PROCEDURE DATE: 09/28/2021  PREOPERATIVE DIAGNOSIS:  POSTOPERATIVE DIAGNOSIS: The same PROCEDURE:  TOTAL LAPAROSCOPIC HYSTERECTOMY WITH BILATERAL SALPINGECTOMY:  INTRAUTERINE DEVICE (IUD) REMOVAL: 68032 (CPT) LAPAROSCOPIC OOPHORECTOMY: 12248 (CPT)  SURGEON:  Dr. Benjaman Kindler, MD ASSISTANT: Dr. Laverta Baltimore, MD  Anesthesiologist:  Anesthesiologist: Darrin Nipper, MD; Piscitello, Precious Haws, MD CRNA: Doreen Salvage, CRNA Student Nurse Anesthetist: Justin Mend, RN  INDICATIONS: 50 y.o. F here for definitive surgical management secondary to the indications listed under preoperative diagnoses; please see preoperative note for further details.  Risks of surgery were discussed with the patient including but not limited to: bleeding which may require transfusion or reoperation; infection which may require antibiotics; injury to bowel, bladder, ureters or other surrounding organs; need for additional procedures; thromboembolic phenomenon, incisional problems and other postoperative/anesthesia complications. Written informed consent was obtained.    FINDINGS:  enlarged fibroid uterus, no iud strings visualized. Enlarged left ovary with complex cyst. Right ovary normal. Normal upper abdomen.  ANESTHESIA:    General INTRAVENOUS FLUIDS:1000  ml ESTIMATED BLOOD LOSS:130 ml URINE OUTPUT: 1300 ml  SPECIMENS: Uterus, cervix, bilateral fallopian tubes, left ovary  COMPLICATIONS: None immediate  PROCEDURE IN DETAIL:  The patient received prophalactic intravenous antibiotics and had sequential compression devices applied to her lower extremities while in the preoperative area.  She was then taken to the operating room where general anesthesia was administered and was found to be adequate.  She was placed in the dorsal lithotomy position, and was prepped and draped in a sterile manner.  A formal time out was performed with all team members present and in agreement.  A V-care  uterine manipulator was placed at this time.  A Foley catheter was inserted into her bladder and attached to constant drainage. Attention was turned to the abdomen and 0.5% Marcaine infused subq. A 39m umbilical incision was made with the scalpel.  The Optiview 5-mm trocar and sleeve were then advanced without difficulty with the laparoscope under direct visualization into the abdomen.  The abdomen was then insufflated with carbon dioxide gas and adequate pneumoperitoneum was obtained.  A survey of the patient's pelvis and abdomen revealed the findings above.  Bilateral lower quadrant ports (5 mm on the right and 5 mm on the left) were then placed under direct visualization.  The pelvis was then carefully examined.  Attention was turned to the fallopian tubes; these were freed from the underlying mesosalpinx and the uterine attachments using the Ligasure device.  On the left, evaluation of the large ovary led to removing the ovary at the IP, after confirming ureter course. The bilateral round and broad ligaments were then clamped and transected with the Ligasure device.  The uterine artery was then skeletonized and a bladder flap was created.  The ureters were noted to be safely away from the area of dissection.  The bladder was then bluntly dissected off the lower uterine segment.    At this point, attention was turned to the uterine vessels, which were clamped and cauterized using the Ligasure on the left, and then the right. After the uterine blood flow at the level of the internal os was controlled, both arteries were cut with the Ligasure.  Good hemostasis was noted overall.  The uterosacral and cardinal ligaments were clamped, cut and ligated bilaterally .  Attention was then turned to the cervicovaginal junction, and monopolar scissors were used to transect the cervix from the surrounding vagina using the ring of the V-care as a guide. This was done circumferentially  allowing total hysterectomy.  The uterus  was then removed from the vagina and the vaginal cuff incision was then closed with running vicryl suture.  Overall excellent hemostasis was noted.    Attention was returned to the abdomen.The ureters were reexamined bilaterally and were pulsating normally. The abdominal pressure was reduced and hemostasis was confirmed.  Bleeding and oozing was noted over the suture line, and Arista 1g was placed over suture line.  All trocars were removed under direct visualization, and the abdomen was desufflated.  All skin incisions were closed with 4-0 Vicryl subcuticular stitches and Dermabond. The patient tolerated the procedures well.  All instruments, needles, and sponge counts were correct x 2. The patient was taken to the recovery room awake, extubated and in stable condition.   Attention was returned to the abdomen.The ureters were reexamined bilaterally and were pulsating normally. The abdominal pressure was reduced and hemostasis was confirmed.   Intravenous floruoceine was administered, and cystoscopy showed bilateral ureteral jets.  No stitches were visualized in the bladder during cystoscopy.  The 53m port fascia was closed with a vertical mattress with 0-Vicryl, using the cone closure system. All trocars were removed under direct visualization, and the abdomen was desufflated.  The fascial incision of the umbilicus was closed with a 0 Vicryl figure of eight stitch.  All skin incisions were closed with 4-0 Vicryl subcuticular stitches and Dermabond. The patient tolerated the procedures well.  All instruments, needles, and sponge counts were correct x 2. The patient was taken to the recovery room awake, extubated and in stable condition.

## 2021-09-29 ENCOUNTER — Encounter: Payer: Self-pay | Admitting: Obstetrics and Gynecology

## 2021-09-29 LAB — SURGICAL PATHOLOGY

## 2021-10-20 ENCOUNTER — Encounter: Payer: Self-pay | Admitting: Physician Assistant

## 2021-10-21 ENCOUNTER — Telehealth: Payer: Self-pay

## 2021-10-21 NOTE — Telephone Encounter (Signed)
Copied from [place] [id number]. Topic: General - Other >> Oct 21, 2021  9:37 AM Leward Quan A wrote: Reason for CRM: Patient would like a call back to schedule her yearly physical medication refill with one of the new providers please can be reached at Ph# [phone]

## 2021-11-10 ENCOUNTER — Encounter: Payer: Self-pay | Admitting: Physician Assistant

## 2021-11-10 ENCOUNTER — Ambulatory Visit (INDEPENDENT_AMBULATORY_CARE_PROVIDER_SITE_OTHER): Payer: BC Managed Care – PPO | Admitting: Physician Assistant

## 2021-11-10 ENCOUNTER — Other Ambulatory Visit: Payer: Self-pay

## 2021-11-10 VITALS — BP 148/83 | HR 77 | Temp 98.3°F | Ht 63.0 in | Wt 230.0 lb

## 2021-11-10 DIAGNOSIS — R739 Hyperglycemia, unspecified: Secondary | ICD-10-CM

## 2021-11-10 DIAGNOSIS — D649 Anemia, unspecified: Secondary | ICD-10-CM

## 2021-11-10 DIAGNOSIS — Z1211 Encounter for screening for malignant neoplasm of colon: Secondary | ICD-10-CM

## 2021-11-10 DIAGNOSIS — Z0000 Encounter for general adult medical examination without abnormal findings: Secondary | ICD-10-CM

## 2021-11-10 DIAGNOSIS — R0981 Nasal congestion: Secondary | ICD-10-CM

## 2021-11-10 DIAGNOSIS — I10 Essential (primary) hypertension: Secondary | ICD-10-CM

## 2021-11-10 DIAGNOSIS — E782 Mixed hyperlipidemia: Secondary | ICD-10-CM | POA: Insufficient documentation

## 2021-11-10 MED ORDER — AZELASTINE HCL 0.1 % NA SOLN: 1.0000 | Freq: Two times a day (BID) | NASAL | 12 refills | Status: AC

## 2021-11-10 NOTE — Assessment & Plan Note (Addendum)
Taking mucinex daily. Advised this daily usage can contribute to weight changes, HTN.  Previously tried flonase unsuccessfully.  Rx azelastine, 1-2 sprays daily, or 1 spray each nostril BID Goal is to stop mucinex daily. F/u 44mo

## 2021-11-10 NOTE — Progress Notes (Signed)
Complete physical exam   Patient: Rachel Wells   DOB: 21-Jun-1971   50 y.o. Female  MRN: [id number] Visit Date: 11/10/2021  Today's healthcare provider: [name], PA-C   Cc. CPE  Subjective    Rachel Wells is a 50 y.o. female who presents today for a complete physical exam.  She reports consuming a general diet. The patient does not participate in regular exercise at present. She generally feels well. She reports sleeping fairly well. She does not have additional problems to discuss today.  Hx recent hysterectomy 09/28/21. Current yeast infection, taking diflucan.   Reports taking Mucinex daily for chronic nasal congestion.  Past Medical History:  Diagnosis Date   BP (high blood pressure) 03/22/2008   Fatigue 04/18/2015   Fatigue 04/18/2015   Fibroid 04/18/2015   Gastroesophageal reflux disease 12/03/2016   Headache, migraine 03/22/2008   Hypertension    Past Surgical History:  Procedure Laterality Date   DILATION AND CURETTAGE OF UTERUS  1992   IUD REMOVAL N/A 09/28/2021   Procedure: INTRAUTERINE DEVICE (IUD) REMOVAL;  Surgeon: Benjaman Kindler, MD;  Location: [facility];  Service: Gynecology;  Laterality: N/A;   TONSILLECTOMY AND ADENOIDECTOMY     TOTAL LAPAROSCOPIC HYSTERECTOMY WITH SALPINGECTOMY Bilateral 09/28/2021   Procedure: TOTAL LAPAROSCOPIC HYSTERECTOMY WITH BILATERAL SALPINGECTOMY;  Surgeon: Benjaman Kindler, MD;  Location: [facility];  Service: Gynecology;  Laterality: Bilateral;   Social History   Socioeconomic History   Marital status: Married    Spouse name: Not on file   Number of children: Not on file   Years of education: Not on file   Highest education level: Not on file  Occupational History   Not on file  Tobacco Use   Smoking status: Never   Smokeless tobacco: Never  Vaping Use   Vaping Use: Never used  Substance and Sexual Activity   Alcohol use: No    Alcohol/week: 0.0 standard drinks   Drug use: No   Sexual activity: Not on  file  Other Topics Concern   Not on file  Social History Narrative   Not on file   Social Determinants of Health   Financial Resource Strain: Not on file  Food Insecurity: Not on file  Transportation Needs: Not on file  Physical Activity: Not on file  Stress: Not on file  Social Connections: Not on file  Intimate Partner Violence: Not on file   Family Status  Relation Name Status   Mother  Alive   Father  Alive   Sister  Alive   Brother  Alive   MGM  Deceased   MGF  Deceased   PGM  Deceased   PGF  Deceased   Family History  Problem Relation Age of Onset   Fibromyalgia Mother    Cancer Mother 20       breast cancer   Hypertension Mother    Thyroid disease Mother    Breast cancer Mother 14   Hypertension Father    Diabetes Brother    Hypertension Paternal Grandmother    Stroke Paternal Grandmother    Heart disease Paternal Grandfather    Allergies  Allergen Reactions   Azithromycin Other (See Comments)   Shrimp [Shellfish Allergy] Swelling   Neomycin-Bacitracin Zn-Polymyx Itching and Rash    REACTION: rash, itch    Patient Care Team: [name], PA-C as PCP - General (Physician Assistant)   Medications: Outpatient Medications Prior to Visit  Medication Sig   cetirizine (ZYRTEC) 10 MG tablet Take 1  tablet (10 mg total) by mouth daily.   Cholecalciferol (VITAMIN D3 PO) Take 50 mg by mouth.   docusate sodium (COLACE) 100 MG capsule Take 1 capsule (100 mg total) by mouth 2 (two) times daily. To keep stools soft   ELDERBERRY PO Take 1 tablet by mouth daily.   EPINEPHrine 0.3 mg/0.3 mL IJ SOAJ injection Inject 0.3 mg into the muscle as needed for anaphylaxis.   guaiFENesin (MUCINEX) 600 MG 12 hr tablet Take 600 mg by mouth daily.   hydrochlorothiazide (MICROZIDE) 12.5 MG capsule Take 1 capsule (12.5 mg total) by mouth daily.   losartan (COZAAR) 50 MG tablet Take 1 tablet (50 mg total) by mouth daily.   metoprolol succinate (TOPROL-XL) 50 MG 24 hr tablet TAKE  1 TABLET BY MOUTH ONCE DAILY IMMEDIATELY  FOLLOWING  A  MEAL   MULTIPLE VITAMIN PO Take 1 tablet by mouth daily.   omeprazole (PRILOSEC) 20 MG capsule Take 1 capsule (20 mg total) by mouth daily.   Probiotic Product (ALIGN PO) Take 1 tablet by mouth 3 (three) times a week.   [DISCONTINUED] fluorouracil (EFUDEX) 5 % cream Apply topically 2 (two) times daily. (Patient not taking: Reported on 09/15/2021)   [DISCONTINUED] gabapentin (NEURONTIN) 800 MG tablet Take 1 tablet (800 mg total) by mouth at bedtime for 14 days. Take nightly for 3 days, then up to 14 days as needed   [DISCONTINUED] oxyCODONE (OXY IR/ROXICODONE) 5 MG immediate release tablet Take 1 tablet (5 mg total) by mouth every 4 (four) hours as needed for severe pain. (Patient not taking: Reported on 11/10/2021)   No facility-administered medications prior to visit.    Review of Systems  Constitutional:  Positive for unexpected weight change. Negative for fatigue and fever.  Eyes: Negative.   Respiratory:  Negative for cough, chest tightness and shortness of breath.   Cardiovascular: Negative.  Negative for chest pain.  Gastrointestinal: Negative.   Endocrine: Negative.   Genitourinary: Negative.   Musculoskeletal: Negative.   Skin: Negative.   Allergic/Immunologic: Negative.   Neurological: Negative.  Negative for weakness and light-headedness.  Hematological: Negative.   Psychiatric/Behavioral: Negative.    All other systems reviewed and are negative.  Last CBC Lab Results  Component Value Date   WBC 9.3 09/21/2021   HGB 11.9 (L) 09/28/2021   HCT 35.0 (L) 09/28/2021   MCV 86.7 09/21/2021   MCH 28.1 09/21/2021   RDW 13.3 09/21/2021   PLT 343 85/46/2703   Last metabolic panel Lab Results  Component Value Date   GLUCOSE 102 (H) 09/28/2021   NA 139 09/28/2021   K 2.9 (L) 09/28/2021   CL 103 09/28/2021   CO2 30 09/21/2021   BUN 13 09/28/2021   CREATININE 0.80 09/28/2021   GFRNONAA >60 09/21/2021   CALCIUM 9.0  09/21/2021   PROT 6.8 12/02/2020   ALBUMIN 4.3 12/02/2020   LABGLOB 2.5 12/02/2020   AGRATIO 1.7 12/02/2020   BILITOT 0.4 12/02/2020   ALKPHOS 93 12/02/2020   AST 17 12/02/2020   ALT 23 12/02/2020   ANIONGAP 8 09/21/2021   Last lipids Lab Results  Component Value Date   CHOL 161 11/05/2020   HDL 39 (L) 11/05/2020   LDLCALC 101 (H) 11/05/2020   TRIG 115 11/05/2020   Last hemoglobin A1c Lab Results  Component Value Date   HGBA1C 5.7 (H) 11/05/2020   Last thyroid functions Lab Results  Component Value Date   TSH 1.320 11/05/2020   Last vitamin D No results found for: 25OHVITD2,  25OHVITD3, VD25OH Last vitamin B12 and Folate No results found for: VITAMINB12, FOLATE    Objective    BP (!) 148/83 (BP Location: Left Arm, Patient Position: Sitting, Cuff Size: Large)   Pulse 77   Temp 98.3 F (36.8 C) (Oral)   Ht 5' 3"  (1.6 m)   Wt 230 lb (104.3 kg)   LMP 09/08/2021 (Approximate)   SpO2 99%   BMI 40.74 kg/m  BP Readings from Last 3 Encounters:  11/10/21 (!) 148/83  09/28/21 (!) 161/90  05/14/21 (!) 154/77   Wt Readings from Last 3 Encounters: Wt Readings from Last 3 Encounters:  11/10/21 230 lb (104.3 kg)  09/18/21 226 lb (102.5 kg)  11/05/20 227 lb (103 kg)      Physical Exam Constitutional:      General: She is awake.     Appearance: She is well-developed.  HENT:     Head: Normocephalic.     Right Ear: Tympanic membrane normal.     Left Ear: Tympanic membrane normal.  Eyes:     Conjunctiva/sclera: Conjunctivae normal.     Pupils: Pupils are equal, round, and reactive to light.  Neck:     Thyroid: No thyroid mass or thyromegaly.  Cardiovascular:     Rate and Rhythm: Normal rate and regular rhythm.     Heart sounds: Normal heart sounds.  Pulmonary:     Effort: Pulmonary effort is normal.     Breath sounds: Normal breath sounds.  Abdominal:     Palpations: Abdomen is soft.     Tenderness: There is no abdominal tenderness.  Musculoskeletal:      Right lower leg: No swelling.     Left lower leg: No swelling.  Lymphadenopathy:     Cervical: No cervical adenopathy.  Skin:    General: Skin is warm.  Neurological:     Mental Status: She is alert and oriented to person, place, and time.  Psychiatric:        Attention and Perception: Attention normal.        Mood and Affect: Mood normal.        Speech: Speech normal.        Behavior: Behavior is cooperative.     Last depression screening scores  PHQ 2/9 Scores 11/10/2021 05/14/2021 11/05/2020  PHQ - 2 Score 0 0 0  PHQ- 9 Score 2 - -   Last fall risk screening Fall Risk  11/10/2021  Falls in the past year? 0  Number falls in past yr: 0  Injury with Fall? 0  Risk for fall due to : No Fall Risks  Follow up -   Last Audit-C alcohol use screening Alcohol Use Disorder Test (AUDIT) 11/10/2021  1. How often do you have a drink containing alcohol? 0  2. How many drinks containing alcohol do you have on a typical day when you are drinking? 0  3. How often do you have six or more drinks on one occasion? 0  AUDIT-C Score 0   A score of 3 or more in women, and 4 or more in men indicates increased risk for alcohol abuse, EXCEPT if all of the points are from question 1   No results found for any visits on 11/10/21.  Assessment & Plan    Routine Health Maintenance and Physical Exam  Exercise Activities and Dietary recommendations  Goals   None   -- Increase activity-- walking, stretching, yoga. Pt has treadmill at home --Focus on balanced diet.  Immunization History  Administered Date(s) Administered   Influenza-Unspecified 09/15/2021   PFIZER Comirnaty(Gray Top)Covid-19 Tri-Sucrose Vaccine 03/27/2020, 04/17/2020, 12/22/2020   PFIZER(Purple Top)SARS-COV-2 Vaccination 03/27/2020, 04/17/2020, 12/22/2020   Tdap 07/13/2015    Health Maintenance  Topic Date Due   Pneumococcal Vaccine 56-7 Years old (1 - PCV) Never done   PAP SMEAR-Modifier  08/09/2020   COVID-19 Vaccine (4 -  Booster for Pfizer series) 11/26/2021 (Originally 02/16/2021)   COLONOSCOPY (Pts 45-19yr Insurance coverage will need to be confirmed)  05/14/2022 (Originally 05/17/2017)   MAMMOGRAM  01/26/2022   TETANUS/TDAP  07/12/2025   INFLUENZA VACCINE  Completed   Hepatitis C Screening  Completed   HIV Screening  Completed   HPV VACCINES  Aged Out    Discussed health benefits of physical activity, and encouraged her to engage in regular exercise appropriate for her age and condition.  Problem List Items Addressed This Visit       Cardiovascular and Mediastinum   Hypertension    Chronic, better controlled than previously. Taking Losartan 50, HCTZ 12.5, metoprolol succinate 50 mg. Goals are for weight loss, BP will improve.  Stay w / current meds, will f/u 3 mo      Relevant Orders   Comprehensive Metabolic Panel (CMET)     Other   Nasal congestion    Taking mucinex daily. Advised this daily usage can contribute to weight changes, HTN.  Previously tried flonase unsuccessfully.  Rx azelastine, 1-2 sprays daily, or 1 spray each nostril BID Goal is to stop mucinex daily. F/u 35mo     Relevant Medications   azelastine (ASTELIN) 0.1 % nasal spray   Mixed hyperlipidemia    Previously LDL > 100 but <180 not managed w/ medication. Will check lipids/cmp.      Relevant Orders   Lipid Profile   Other Visit Diagnoses     Encounter for physical examination    -  Primary   Screening for malignant neoplasm of colon       Relevant Orders   Cologuard   Anemia, unspecified type       Relevant Orders   CBC   Iron, TIBC and Ferritin Panel        Return in about 3 months (around 02/09/2022) for HTN, nasal congestion.     I, LiMikey KirschnerPA-C have reviewed all documentation for this visit. The documentation on  11/10/2021 for the exam, diagnosis, procedures, and orders are all accurate and complete.    LiMikey KirschnerPA-C  [phone]) [phone](fax)  [place]

## 2021-11-10 NOTE — Assessment & Plan Note (Addendum)
Previously LDL > 100 but <180 not managed w/ medication. Will check lipids/cmp.

## 2021-11-10 NOTE — Assessment & Plan Note (Signed)
Chronic, better controlled than previously. Taking Losartan 50, HCTZ 12.5, metoprolol succinate 50 mg. Goals are for weight loss, BP will improve.  Stay w / current meds, will f/u 3 mo

## 2021-11-11 LAB — IRON,TIBC AND FERRITIN PANEL
Ferritin: 126 ng/mL (ref 15–150)
Iron Saturation: 19 % (ref 15–55)
Iron: 50 ug/dL (ref 27–159)
Total Iron Binding Capacity: 270 ug/dL (ref 250–450)
UIBC: 220 ug/dL (ref 131–425)

## 2021-11-11 LAB — HEMOGLOBIN A1C
Est. average glucose Bld gHb Est-mCnc: 117 mg/dL
Hgb A1c MFr Bld: 5.7 % — ABNORMAL HIGH (ref 4.8–5.6)

## 2021-11-11 LAB — LIPID PANEL
Chol/HDL Ratio: 3.9 ratio (ref 0.0–4.4)
Cholesterol, Total: 181 mg/dL (ref 100–199)
HDL: 47 mg/dL (ref >39)
LDL Chol Calc (NIH): 114 mg/dL — ABNORMAL HIGH (ref 0–99)
Triglycerides: 110 mg/dL (ref 0–149)
VLDL Cholesterol Cal: 20 mg/dL (ref 5–40)

## 2021-11-11 LAB — CBC
Hematocrit: 38.8 % (ref 34.0–46.6)
Hemoglobin: 12.7 g/dL (ref 11.1–15.9)
MCH: 27.7 pg (ref 26.6–33.0)
MCHC: 32.7 g/dL (ref 31.5–35.7)
MCV: 85 fL (ref 79–97)
Platelets: 313 10*3/uL (ref 150–450)
RBC: 4.59 x10E6/uL (ref 3.77–5.28)
RDW: 13.1 % (ref 11.7–15.4)
WBC: 7.7 10*3/uL (ref 3.4–10.8)

## 2021-11-11 LAB — COMPREHENSIVE METABOLIC PANEL
ALT: 26 IU/L (ref 0–32)
AST: 19 IU/L (ref 0–40)
Albumin/Globulin Ratio: 1.7 (ref 1.2–2.2)
Albumin: 4.2 g/dL (ref 3.8–4.8)
Alkaline Phosphatase: 96 IU/L (ref 44–121)
BUN/Creatinine Ratio: 14 (ref 9–23)
BUN: 13 mg/dL (ref 6–24)
Bilirubin Total: 0.3 mg/dL (ref 0.0–1.2)
CO2: 27 mmol/L (ref 20–29)
Calcium: 9.6 mg/dL (ref 8.7–10.2)
Chloride: 100 mmol/L (ref 96–106)
Creatinine, Ser: 0.9 mg/dL (ref 0.57–1.00)
Globulin, Total: 2.5 g/dL (ref 1.5–4.5)
Glucose: 91 mg/dL (ref 70–99)
Potassium: 4 mmol/L (ref 3.5–5.2)
Sodium: 140 mmol/L (ref 134–144)
Total Protein: 6.7 g/dL (ref 6.0–8.5)
eGFR: 78 mL/min/{1.73_m2} (ref >59)

## 2021-11-13 ENCOUNTER — Ambulatory Visit: Payer: BC Managed Care – PPO | Admitting: Podiatry

## 2021-12-03 ENCOUNTER — Telehealth: Payer: Self-pay | Admitting: Podiatry

## 2021-12-03 NOTE — Telephone Encounter (Signed)
Patient called she has an appt tomorrow with Dr Amalia Hailey for wart removal. Patient put cream on warts and they are all blistered up. Pt wants to know if she can still come or does she need to come a different day? Pts number [phone].

## 2021-12-04 ENCOUNTER — Ambulatory Visit: Payer: BC Managed Care – PPO | Admitting: Podiatry

## 2021-12-04 LAB — COLOGUARD: COLOGUARD: NEGATIVE

## 2021-12-04 NOTE — Telephone Encounter (Signed)
That's fine. She can come in at her convenience next scheduled appt. - Dr. Amalia Hailey

## 2021-12-11 NOTE — Telephone Encounter (Signed)
Patient is going to call next week when she has her schedule to set something up.

## 2021-12-21 ENCOUNTER — Encounter: Payer: Self-pay | Admitting: Physician Assistant

## 2021-12-28 ENCOUNTER — Other Ambulatory Visit: Payer: Self-pay | Admitting: Physician Assistant

## 2021-12-28 DIAGNOSIS — I10 Essential (primary) hypertension: Secondary | ICD-10-CM

## 2021-12-30 ENCOUNTER — Other Ambulatory Visit: Payer: Self-pay | Admitting: Physician Assistant

## 2021-12-30 DIAGNOSIS — J302 Other seasonal allergic rhinitis: Secondary | ICD-10-CM

## 2022-01-11 ENCOUNTER — Other Ambulatory Visit: Payer: Self-pay | Admitting: Physician Assistant

## 2022-01-11 DIAGNOSIS — I10 Essential (primary) hypertension: Secondary | ICD-10-CM

## 2022-02-09 ENCOUNTER — Ambulatory Visit: Payer: BC Managed Care – PPO | Admitting: Physician Assistant

## 2022-02-09 NOTE — Progress Notes (Signed)
I,[name],acting as a Education administrator for [employer], PA-C.,have documented all relevant documentation on the behalf of Rachel Kirschner, PA-C,as directed by  Rachel Kirschner, PA-C while in the presence of Rachel Kirschner, PA-C.  Established Patient Office Visit  Subjective:  Patient ID: Rachel Wells, female    DOB: 07/07/1971  Age: 51 y.o. MRN: [id number]  CC: cc htn f/u   HPI  Hypertension, follow-up  BP Readings from Last 3 Encounters:  02/10/22 (!) 143/87  11/10/21 (!) 148/83  09/28/21 (!) 161/90   Wt Readings from Last 3 Encounters:  02/10/22 218 lb 8 oz (99.1 kg)  11/10/21 230 lb (104.3 kg)  09/18/21 226 lb (102.5 kg)     She was last seen for hypertension 3 months ago.  BP at that visit was 148/83. Management since that visit includes continue losartan 50, HCTZ 12.5, metoprolol succinate 50 mg.  She reports excellent compliance with treatment. She is not having side effects. She is following a Low Sodium diet. She is exercising. She does not smoke.  Use of agents associated with hypertension: none.   Outside blood pressures are 148/87, or similar. Priscillia has lost 15 pounds since our last visit, with diet and exercise. Symptoms: No chest pain No chest pressure  No palpitations No syncope  No dyspnea No orthopnea  No paroxysmal nocturnal dyspnea Yes lower extremity edema   Pertinent labs: Lab Results  Component Value Date   CHOL 181 11/10/2021   HDL 47 11/10/2021   LDLCALC 114 (H) 11/10/2021   TRIG 110 11/10/2021   CHOLHDL 3.9 11/10/2021   Lab Results  Component Value Date   NA 140 11/10/2021   K 4.0 11/10/2021   CREATININE 0.90 11/10/2021   EGFR 78 11/10/2021   GLUCOSE 91 11/10/2021   TSH 1.320 11/05/2020     The 10-year ASCVD risk score (Arnett DK, et al., 2019) is: 2%   ---------------------------------------------------------------------------------------------------  She reports one episode of dizziness,when at the dentist and lying flat  with her head in a certain position for a long time, and when she sat up she felt dizzy for a few seconds before she was able to stand up. Resolved quickly. Denies dizziness when going from sitting to standing, denies syncope.  Past Medical History:  Diagnosis Date   BP (high blood pressure) 03/22/2008   Fatigue 04/18/2015   Fatigue 04/18/2015   Fibroid 04/18/2015   Gastroesophageal reflux disease 12/03/2016   Headache, migraine 03/22/2008   Hypertension     Past Surgical History:  Procedure Laterality Date   DILATION AND CURETTAGE OF UTERUS  1992   IUD REMOVAL N/A 09/28/2021   Procedure: INTRAUTERINE DEVICE (IUD) REMOVAL;  Surgeon: Benjaman Kindler, MD;  Location: [facility] ORS;  Service: Gynecology;  Laterality: N/A;   TONSILLECTOMY AND ADENOIDECTOMY     TOTAL LAPAROSCOPIC HYSTERECTOMY WITH SALPINGECTOMY Bilateral 09/28/2021   Procedure: TOTAL LAPAROSCOPIC HYSTERECTOMY WITH BILATERAL SALPINGECTOMY;  Surgeon: Benjaman Kindler, MD;  Location: [facility] ORS;  Service: Gynecology;  Laterality: Bilateral;    Family History  Problem Relation Age of Onset   Fibromyalgia Mother    Cancer Mother 39       breast cancer   Hypertension Mother    Thyroid disease Mother    Breast cancer Mother 73   Hypertension Father    Diabetes Brother    Hypertension Paternal Grandmother    Stroke Paternal Grandmother    Heart disease Paternal Grandfather     Social History   Socioeconomic History   Marital status: Married  Spouse name: Not on file   Number of children: Not on file   Years of education: Not on file   Highest education level: Not on file  Occupational History   Not on file  Tobacco Use   Smoking status: Never   Smokeless tobacco: Never  Vaping Use   Vaping Use: Never used  Substance and Sexual Activity   Alcohol use: No    Alcohol/week: 0.0 standard drinks   Drug use: No   Sexual activity: Not on file  Other Topics Concern   Not on file  Social History Narrative   Not on file    Social Determinants of Health   Financial Resource Strain: Not on file  Food Insecurity: Not on file  Transportation Needs: Not on file  Physical Activity: Not on file  Stress: Not on file  Social Connections: Not on file  Intimate Partner Violence: Not on file    Outpatient Medications Prior to Visit  Medication Sig Dispense Refill   azelastine (ASTELIN) 0.1 % nasal spray Place 1-2 sprays into both nostrils 2 (two) times daily. Use in each nostril as directed 30 mL 12   ELDERBERRY PO Take 1 tablet by mouth daily.     EQ ALLERGY RELIEF, CETIRIZINE, 10 MG tablet Take 1 tablet by mouth once daily 90 tablet 0   guaiFENesin (MUCINEX) 600 MG 12 hr tablet Take 600 mg by mouth as needed.     hydrochlorothiazide (MICROZIDE) 12.5 MG capsule Take 1 capsule by mouth once daily 90 capsule 0   metoprolol succinate (TOPROL-XL) 50 MG 24 hr tablet TAKE 1 TABLET BY MOUTH ONCE DAILY IMMEDIATELY FOLLOWING A MEAL 90 tablet 1   MULTIPLE VITAMIN PO Take 1 tablet by mouth daily.     omeprazole (PRILOSEC) 20 MG capsule Take 1 capsule (20 mg total) by mouth daily. (Patient taking differently: Take 20 mg by mouth as needed.) 90 capsule 3   Probiotic Product (ALIGN PO) Take 1 tablet by mouth 3 (three) times a week.     EPINEPHrine 0.3 mg/0.3 mL IJ SOAJ injection Inject 0.3 mg into the muscle as needed for anaphylaxis. 1 each 3   losartan (COZAAR) 50 MG tablet Take 1 tablet by mouth once daily 90 tablet 0   Cholecalciferol (VITAMIN D3 PO) Take 50 mg by mouth. (Patient not taking: Reported on 02/10/2022)     docusate sodium (COLACE) 100 MG capsule Take 1 capsule (100 mg total) by mouth 2 (two) times daily. To keep stools soft (Patient not taking: Reported on 02/10/2022) 30 capsule 0   No facility-administered medications prior to visit.    Allergies  Allergen Reactions   Azithromycin Other (See Comments)   Shrimp [Shellfish Allergy] Swelling   Neomycin-Bacitracin Zn-Polymyx Itching and Rash    REACTION: rash,  itch    ROS Review of Systems  Constitutional:  Negative for fatigue and fever.  Respiratory:  Negative for cough and shortness of breath.   Cardiovascular:  Negative for chest pain and leg swelling.  Gastrointestinal:  Negative for abdominal pain.  Neurological:  Negative for dizziness and headaches.     Objective:    Physical Exam Constitutional:      Appearance: Normal appearance. She is not ill-appearing.  HENT:     Head: Normocephalic.  Eyes:     Conjunctiva/sclera: Conjunctivae normal.  Cardiovascular:     Rate and Rhythm: Normal rate and regular rhythm.     Pulses: Normal pulses.     Heart sounds: Normal heart sounds.  Pulmonary:     Effort: Pulmonary effort is normal.     Breath sounds: Normal breath sounds.  Musculoskeletal:     Right lower leg: No edema.     Left lower leg: No edema.  Neurological:     Mental Status: She is oriented to person, place, and time.  Psychiatric:        Mood and Affect: Mood normal.        Behavior: Behavior normal.    BP (!) 143/87 (BP Location: Left Arm, Patient Position: Sitting, Cuff Size: Large)    Pulse 78    Temp 98.7 F (37.1 C) (Oral)    Ht 5' 3"  (1.6 m)    Wt 218 lb 8 oz (99.1 kg)    LMP 09/08/2021 (Approximate)    SpO2 99%    BMI 38.71 kg/m  Wt Readings from Last 3 Encounters:  02/10/22 218 lb 8 oz (99.1 kg)  11/10/21 230 lb (104.3 kg)  09/18/21 226 lb (102.5 kg)     Health Maintenance Due  Topic Date Due   PAP SMEAR-Modifier  08/09/2020   COVID-19 Vaccine (6 - Booster) 02/16/2021    There are no preventive care reminders to display for this patient.  Lab Results  Component Value Date   TSH 1.320 11/05/2020   Lab Results  Component Value Date   WBC 7.7 11/10/2021   HGB 12.7 11/10/2021   HCT 38.8 11/10/2021   MCV 85 11/10/2021   PLT 313 11/10/2021   Lab Results  Component Value Date   NA 140 11/10/2021   K 4.0 11/10/2021   CO2 27 11/10/2021   GLUCOSE 91 11/10/2021   BUN 13 11/10/2021    CREATININE 0.90 11/10/2021   BILITOT 0.3 11/10/2021   ALKPHOS 96 11/10/2021   AST 19 11/10/2021   ALT 26 11/10/2021   PROT 6.7 11/10/2021   ALBUMIN 4.2 11/10/2021   CALCIUM 9.6 11/10/2021   ANIONGAP 8 09/21/2021   EGFR 78 11/10/2021   Lab Results  Component Value Date   CHOL 181 11/10/2021   Lab Results  Component Value Date   HDL 47 11/10/2021   Lab Results  Component Value Date   LDLCALC 114 (H) 11/10/2021   Lab Results  Component Value Date   TRIG 110 11/10/2021   Lab Results  Component Value Date   CHOLHDL 3.9 11/10/2021   Lab Results  Component Value Date   HGBA1C 5.7 (H) 11/10/2021      Assessment & Plan:   Problem List Items Addressed This Visit       Cardiovascular and Mediastinum   Hypertension - Primary    No improvement w/ 15 pound weight loss. Advised increase losartan 50 to 100. Take two of current pill and 100 mg rx sent.  Continue hctz 12.5 mg and metorpolol succinate 50 mg.  F/u 1 mo virtually w/ home values. Pt agrees to compliance. Continue w/ diet and exercise changes      Relevant Medications   EPINEPHrine 0.3 mg/0.3 mL IJ SOAJ injection   losartan (COZAAR) 100 MG tablet (Start on 02/24/2022)     Other   Allergic to shellfish    Refilled epipen      Relevant Medications   EPINEPHrine 0.3 mg/0.3 mL IJ SOAJ injection   Vertigo    One episode, explained vertigo and positional triggers. Will monitor        Follow-up: Return in about 1 month (around 03/13/2022) for hypertension.    I, Rachel Kirschner, PA-C have  reviewed all documentation for this visit. The documentation on  02/10/2022  for the exam, diagnosis, procedures, and orders are all accurate and complete.  Rachel Kirschner, PA-C [facility] [address] [place], [place], [address] Office: [phone] Fax: [phone]

## 2022-02-10 ENCOUNTER — Ambulatory Visit: Payer: BC Managed Care – PPO | Admitting: Physician Assistant

## 2022-02-10 ENCOUNTER — Encounter: Payer: Self-pay | Admitting: Physician Assistant

## 2022-02-10 ENCOUNTER — Other Ambulatory Visit: Payer: Self-pay

## 2022-02-10 VITALS — BP 143/87 | HR 78 | Temp 98.7°F | Ht 63.0 in | Wt 218.5 lb

## 2022-02-10 DIAGNOSIS — R42 Dizziness and giddiness: Secondary | ICD-10-CM

## 2022-02-10 DIAGNOSIS — Z91013 Allergy to seafood: Secondary | ICD-10-CM | POA: Diagnosis not present

## 2022-02-10 DIAGNOSIS — I10 Essential (primary) hypertension: Secondary | ICD-10-CM | POA: Diagnosis not present

## 2022-02-10 MED ORDER — LOSARTAN POTASSIUM 100 MG PO TABS: 100.0000 mg | ORAL_TABLET | Freq: Every day | ORAL | 1 refills | Status: DC

## 2022-02-10 MED ORDER — EPINEPHRINE 0.3 MG/0.3ML IJ SOAJ: 0.3000 mg | INTRAMUSCULAR | 1 refills | Status: AC | PRN

## 2022-02-10 NOTE — Assessment & Plan Note (Signed)
Refilled epipen ?

## 2022-02-10 NOTE — Assessment & Plan Note (Signed)
No improvement w/ 15 pound weight loss. ?Advised increase losartan 50 to 100. Take two of current pill and 100 mg rx sent.  ?Continue hctz 12.5 mg and metorpolol succinate 50 mg. ? ?F/u 1 mo virtually w/ home values. Pt agrees to compliance. ?Continue w/ diet and exercise changes ?

## 2022-02-10 NOTE — Patient Instructions (Signed)
Please contact [facility] to schedule your mammogram. They can be reached at [phone].  ?

## 2022-02-10 NOTE — Assessment & Plan Note (Signed)
One episode, explained vertigo and positional triggers. ?Will monitor ?

## 2022-03-04 ENCOUNTER — Other Ambulatory Visit: Payer: Self-pay | Admitting: Physician Assistant

## 2022-03-04 DIAGNOSIS — K219 Gastro-esophageal reflux disease without esophagitis: Secondary | ICD-10-CM

## 2022-03-08 ENCOUNTER — Other Ambulatory Visit: Payer: Self-pay

## 2022-03-08 ENCOUNTER — Ambulatory Visit
Admission: RE | Admit: 2022-03-08 | Discharge: 2022-03-08 | Disposition: A | Discharge status: Home / Self Care | Payer: BC Managed Care – PPO | Source: Ambulatory Visit | Attending: Obstetrics and Gynecology | Admitting: Obstetrics and Gynecology

## 2022-03-08 DIAGNOSIS — N6320 Unspecified lump in the left breast, unspecified quadrant: Secondary | ICD-10-CM

## 2022-03-16 ENCOUNTER — Ambulatory Visit: Payer: BC Managed Care – PPO | Admitting: Physician Assistant

## 2022-03-23 ENCOUNTER — Encounter: Payer: Self-pay | Admitting: Physician Assistant

## 2022-03-23 NOTE — Progress Notes (Signed)
? ? ?I,[name],acting as a Education administrator for [employer], PA-C.,have documented all relevant documentation on the behalf of [name], PA-C,as directed by  [name], PA-C while in the presence of [name], PA-C. ? ?MyChart Video Visit ? ? ? ?Virtual Visit via Video Note  ? ?This visit type was conducted due to national recommendations for restrictions regarding the COVID-19 Pandemic (e.g. social distancing) in an effort to limit this patient's exposure and mitigate transmission in our community. This patient is at least at moderate risk for complications without adequate follow up. This format is felt to be most appropriate for this patient at this time. Physical exam was limited by quality of the video and audio technology used for the visit.  ? ?Patient location: home ?Provider location: [facility] ? ?I discussed the limitations of evaluation and management by telemedicine and the availability of in person appointments. The patient expressed understanding and agreed to proceed. ? ?Patient: Rachel Wells   DOB: February 26, 1971   51 y.o. Female  MRN: [id number] ?Visit Date: 03/24/2022 ? ?Today's healthcare provider: [name], PA-C  ? ?Cc. Htn fu ? ?Subjective  ?  ?HPI  ?Patient is being seen today for one month hypertension follow-up. Blood pressure this morning was 139/87 with a pulse of 73 taking on her left arm at 7:30 am. ?Hypertension, follow-up ? ?BP Readings from Last 3 Encounters:  ?03/24/22 128/75  ?02/10/22 (!) 143/87  ?11/10/21 (!) 148/83  ? Wt Readings from Last 3 Encounters:  ?02/10/22 218 lb 8 oz (99.1 kg)  ?11/10/21 230 lb (104.3 kg)  ?09/18/21 226 lb (102.5 kg)  ?  ? ?She was last seen for hypertension 1 months ago.  ?BP at that visit was 143/87. Management since that visit includes increase losartan 50 to 100. Take two of current pill and 100 mg rx sent.  ?Continue hctz 12.5 mg and metorpolol succinate 50 mg.Continue change in diet and exercise. ? ?She reports  excellent compliance with treatment. ?She is not having side effects.  ?She is following a Low Sodium diet. ?She is exercising. ?She does not smoke. ? ?Use of agents associated with hypertension: none.  ? ?Outside blood pressures are as follows: ?    151/83  pulse rate 80  6:42pm 02/24/22 ?144/83                        96  11:16am 02/27/22 ?139/73                        88  11:11am  03/06/22 ?128/75                        80   7:14am    03/17/22 ?146/79                        67   10:50am  03/18/22 ?135/81                        96    5:25pm    03/21/22 ?138/77                        83    6:31pm     03/22/22 ? ? ?Symptoms: ?No chest pain No chest pressure  ?No palpitations No syncope  ?No dyspnea No orthopnea  ?No paroxysmal nocturnal dyspnea No  lower extremity edema  ? ?Pertinent labs ?Lab Results  ?Component Value Date  ? CHOL 181 11/10/2021  ? HDL 47 11/10/2021  ? LDLCALC 114 (H) 11/10/2021  ? TRIG 110 11/10/2021  ? CHOLHDL 3.9 11/10/2021  ? Lab Results  ?Component Value Date  ? NA 140 11/10/2021  ? K 4.0 11/10/2021  ? CREATININE 0.90 11/10/2021  ? EGFR 78 11/10/2021  ? GLUCOSE 91 11/10/2021  ? TSH 1.320 11/05/2020  ?  ? ?The 10-year ASCVD risk score (Arnett DK, et al., 2019) is: 1.6% ? ?---------------------------------------------------------------------------------------------------  ?She also reports concerns over a sinus infection today. Thought it was allergies--sore throat, nasal congestion, itchy eyes x 2 weeks ago but progressed to headaches , productive cough w/ green mucous, tooth pain, sinus pain.  ?Using zyrtec, saline nasal spray, azelastine nasal spray, mucinex. ? ?She also has concerns over adult acne flares and weight gain, weighed 227 lb today, increased from 218 a month ago. Denies peripheral edema, chest tightness.  ? ?Medications: ?Outpatient Medications Prior to Visit  ?Medication Sig  ? azelastine (ASTELIN) 0.1 % nasal spray Place 1-2 sprays into both nostrils 2 (two) times daily. Use in each  nostril as directed  ? ELDERBERRY PO Take 1 tablet by mouth daily.  ? EPINEPHrine 0.3 mg/0.3 mL IJ SOAJ injection Inject 0.3 mg into the muscle as needed for anaphylaxis.  ? EQ ALLERGY RELIEF, CETIRIZINE, 10 MG tablet Take 1 tablet by mouth once daily  ? guaiFENesin (MUCINEX) 600 MG 12 hr tablet Take 600 mg by mouth as needed.  ? hydrochlorothiazide (MICROZIDE) 12.5 MG capsule Take 1 capsule by mouth once daily  ? losartan (COZAAR) 100 MG tablet Take 1 tablet (100 mg total) by mouth daily.  ? metoprolol succinate (TOPROL-XL) 50 MG 24 hr tablet TAKE 1 TABLET BY MOUTH ONCE DAILY IMMEDIATELY FOLLOWING A MEAL  ? MULTIPLE VITAMIN PO Take 1 tablet by mouth daily.  ? omeprazole (PRILOSEC) 20 MG capsule Take 1 capsule by mouth once daily  ? Probiotic Product (ALIGN PO) Take 1 tablet by mouth 3 (three) times a week.  ? ?No facility-administered medications prior to visit.  ? ? ?Review of Systems  ?Constitutional:  Positive for unexpected weight change. Negative for fatigue and fever.  ?HENT:  Positive for congestion, postnasal drip, rhinorrhea, sinus pressure, sinus pain, sore throat and voice change.   ?Respiratory:  Positive for cough. Negative for shortness of breath.   ?Cardiovascular:  Negative for chest pain and leg swelling.  ?Gastrointestinal:  Negative for abdominal pain.  ?Skin:  Positive for rash.  ?Neurological:  Negative for dizziness and headaches.  ? ? Objective  ?  ?LMP 09/08/21 hysterectomy  ?Blood pressure 128/75, pulse 73, last menstrual period 09/08/2021.  ? ? ?Physical Exam ?Vitals reviewed.  ?Constitutional:   ?   Appearance: She is not ill-appearing.  ?Pulmonary:  ?   Effort: Pulmonary effort is normal. No respiratory distress.  ?Neurological:  ?   General: No focal deficit present.  ?   Mental Status: She is alert and oriented to person, place, and time.  ?Psychiatric:     ?   Mood and Affect: Mood normal.     ?   Behavior: Behavior normal.  ?  ? ? ? Assessment & Plan  ?  ? ?Problem List Items  Addressed This Visit   ? ?  ? Cardiovascular and Mediastinum  ? Hypertension - Primary  ?  Continue losartan 100 mg, hctz 12.5 mg, metoprolol 50 mg. ?Home bp  in range ?  ?  ?  ? Other  ? Weight gain  ?  Advised f/u in office 2-3 months to assess weight and acne ?Advised differin gel otc for acne ?May need repeat bw--fasting glucose/A1c ?S/p hysterectomy unsure if menopausal ?  ?  ? ?Other Visit Diagnoses   ? ? Acute non-recurrent frontal sinusitis      ? ?  ? 3. Acute sinusitis ?Continue OTC remedies ?Rx amoxicillin 875 mg BID x 7 days. Reports severe stomach upset with augmentin and historically per pt doxycycline did not work ? ?Return in about 3 months (around 06/23/2022) for hypertension, weight Management.  ?  ? ?I discussed the assessment and treatment plan with the patient. The patient was provided an opportunity to ask questions and all were answered. The patient agreed with the plan and demonstrated an understanding of the instructions. ?  ?The patient was advised to call back or seek an in-person evaluation if the symptoms worsen or if the condition fails to improve as anticipated. ? ?I provided 20 minutes of non-face-to-face time during this encounter. ? ?I, [name], PA-C have reviewed all documentation for this visit. The documentation on  03/24/2022 for the exam, diagnosis, procedures, and orders are all accurate and complete. ? ?[name], PA-C ?[facility] ?[address] ?[place], [place], [address] ?Office: [phone] ?Fax: [phone]  ? ?[facility]  ? ?

## 2022-03-24 ENCOUNTER — Telehealth: Payer: BC Managed Care – PPO | Admitting: Physician Assistant

## 2022-03-24 ENCOUNTER — Encounter: Payer: Self-pay | Admitting: Physician Assistant

## 2022-03-24 VITALS — BP 128/75 | HR 73

## 2022-03-24 DIAGNOSIS — R635 Abnormal weight gain: Secondary | ICD-10-CM | POA: Diagnosis not present

## 2022-03-24 DIAGNOSIS — J0110 Acute frontal sinusitis, unspecified: Secondary | ICD-10-CM

## 2022-03-24 DIAGNOSIS — I10 Essential (primary) hypertension: Secondary | ICD-10-CM | POA: Diagnosis not present

## 2022-03-24 MED ORDER — AMOXICILLIN 875 MG PO TABS: 875.0000 mg | ORAL_TABLET | Freq: Two times a day (BID) | ORAL | 0 refills | Status: AC

## 2022-03-24 NOTE — Assessment & Plan Note (Signed)
Continue losartan 100 mg, hctz 12.5 mg, metoprolol 50 mg. ?Home bp in range ?

## 2022-03-24 NOTE — Assessment & Plan Note (Addendum)
Advised f/u in office 2-3 months to assess weight and acne ?Advised differin gel otc for acne ?May need repeat bw--fasting glucose/A1c ?S/p hysterectomy unsure if menopausal ?

## 2022-03-24 NOTE — Patient Instructions (Signed)
Differin Gel for cystic acne  ?

## 2022-04-02 ENCOUNTER — Ambulatory Visit: Payer: BC Managed Care – PPO | Admitting: Podiatry

## 2022-04-02 DIAGNOSIS — B070 Plantar wart: Secondary | ICD-10-CM | POA: Diagnosis not present

## 2022-04-02 NOTE — Progress Notes (Signed)
? ?Subjective: ?51 y.o. female presenting for recurrent warts to the bilateral feet. Patient last seen in the office 06/26/2021.  Patient has a history of plantar warts to the bottom of the feet.  We have tried multiple conservative treatment modalities in the past and finally decided to perform excisional biopsy of the plantar verruca lesions bilateral.  This was performed on 06/12/2021.  Patient states that she was doing very well until the past few months when she began to notice recurrence of warts.  There are now a cluster of warts to her left foot and scattered warts to the right foot.  She presents for further treatment and evaluation.  Most recently she has been applying some salicylic acid for the wart lesions. ?  ?Past Medical History:  ?Diagnosis Date  ? BP (high blood pressure) 03/22/2008  ? Fatigue 04/18/2015  ? Fatigue 04/18/2015  ? Fibroid 04/18/2015  ? Gastroesophageal reflux disease 12/03/2016  ? Headache, migraine 03/22/2008  ? Hypertension   ? ?Past Surgical History:  ?Procedure Laterality Date  ? DILATION AND CURETTAGE OF UTERUS  1992  ? IUD REMOVAL N/A 09/28/2021  ? Procedure: INTRAUTERINE DEVICE (IUD) REMOVAL;  Surgeon: Benjaman Kindler, MD;  Location: [facility] ORS;  Service: Gynecology;  Laterality: N/A;  ? TONSILLECTOMY AND ADENOIDECTOMY    ? TOTAL LAPAROSCOPIC HYSTERECTOMY WITH SALPINGECTOMY Bilateral 09/28/2021  ? Procedure: TOTAL LAPAROSCOPIC HYSTERECTOMY WITH BILATERAL SALPINGECTOMY;  Surgeon: Benjaman Kindler, MD;  Location: [facility] ORS;  Service: Gynecology;  Laterality: Bilateral;  ? ?Allergies  ?Allergen Reactions  ? Azithromycin Other (See Comments)  ? Shrimp [Shellfish Allergy] Swelling  ? Neomycin-Bacitracin Zn-Polymyx Itching and Rash  ?  REACTION: rash, itch  ? ? ? ? ?Objective: ?Physical Exam ?General: The patient is alert and oriented x3 in no acute distress. ?  ?Dermatology: Hyperkeratotic skin lesions noted to the plantar aspect of the bilateral feet approximately 1 cm in diameter.   Findings consistent with multiple plantar verruca's.  The lesions appear single solitary nodular lesions.  There is associated tenderness to palpation ? ?Vascular: Palpable pedal pulses bilaterally. No edema or erythema noted. Capillary refill within normal limits. ?  ?Neurological: Epicritic and protective threshold grossly intact bilaterally.  ?  ?Musculoskeletal Exam: No pedal deformities noted ?  ?Assessment: ?1.  Recurrent plantar wart bilateral feet ? ?  ?Plan of Care:  ?1. Patient was evaluated. ?2.  Today we discussed different treatment options both conservative and surgical.  Last year we had attempted multiple conservative treatment modalities and they failed to remove the verruca lesions and finally we proceeded with excision of the lesions.  The patient would like to forego the more conservative treatments since they have failed in the past and proceed with excision of the lesions.  Unfortunately there are so many lesions that I do not believe we could pursue this option in the office due to pain and numbing.  Recommend that this be performed at the surgery center.  The patient agrees.  All possible complications and details of the procedure were explained.  No guarantees were expressed or implied.  At the surgery center we would also pursue laser treatment after excision ?3.  Authorization for surgery was initiated today.  Surgery will consist of excisional biopsy plantar verruca bilateral with possible left laser treatment therapy ?4.  Return to clinic 1 week postop ? ?Works at [employer] sitting and standing ? ?Edrick Kins, DPM ?[place] ? ?Dr. Edrick Kins, DPM  ?  ?[address]                                   ?[place], [place] [address]                ?Office [phone]  ?Fax [phone] ? ? ? ? ? ?

## 2022-04-05 ENCOUNTER — Telehealth: Payer: Self-pay

## 2022-04-05 NOTE — Telephone Encounter (Signed)
Received surgery paperwork from the [facility] office. Left a message for Afiya to call and scheduled surgery with Dr. Amalia Hailey. ?

## 2022-05-02 ENCOUNTER — Other Ambulatory Visit: Payer: Self-pay | Admitting: Physician Assistant

## 2022-05-02 DIAGNOSIS — I10 Essential (primary) hypertension: Secondary | ICD-10-CM

## 2022-05-02 DIAGNOSIS — J302 Other seasonal allergic rhinitis: Secondary | ICD-10-CM

## 2022-05-31 ENCOUNTER — Other Ambulatory Visit: Payer: Self-pay | Admitting: Physician Assistant

## 2022-05-31 DIAGNOSIS — K219 Gastro-esophageal reflux disease without esophagitis: Secondary | ICD-10-CM

## 2022-06-12 ENCOUNTER — Other Ambulatory Visit: Payer: Self-pay | Admitting: Physician Assistant

## 2022-06-12 DIAGNOSIS — I10 Essential (primary) hypertension: Secondary | ICD-10-CM

## 2022-07-08 ENCOUNTER — Encounter: Payer: Self-pay | Admitting: Physician Assistant

## 2022-07-09 ENCOUNTER — Ambulatory Visit: Payer: BC Managed Care – PPO

## 2022-07-09 ENCOUNTER — Ambulatory Visit: Payer: Self-pay | Admitting: *Deleted

## 2022-07-09 NOTE — Telephone Encounter (Signed)
Noted  Pt advised to go to UC.  KP

## 2022-07-09 NOTE — Telephone Encounter (Signed)
Reason for Disposition  [1] MODERATE diarrhea (e.g., 4-6 times / day more than normal) AND [2] present > 48 hours (2 days)  Answer Assessment - Initial Assessment Questions 1. DIARRHEA SEVERITY: "How bad is the diarrhea?" "How many more stools have you had in the past 24 hours than normal?"    - NO DIARRHEA (SCALE 0)   - MILD (SCALE 1-3): Few loose or mushy BMs; increase of 1-3 stools over normal daily number of stools; mild increase in ostomy output.   -  MODERATE (SCALE 4-7): Increase of 4-6 stools daily over normal; moderate increase in ostomy output.   -  SEVERE (SCALE 8-10; OR "WORST POSSIBLE"): Increase of 7 or more stools daily over normal; moderate increase in ostomy output; incontinence.     moderate 2. ONSET: "When did the diarrhea begin?"      Tuesday 3. BM CONSISTENCY: "How loose or watery is the diarrhea?"      loose 4. VOMITING: "Are you also vomiting?" If Yes, ask: "How many times in the past 24 hours?"      no 5. ABDOMEN PAIN: "Are you having any abdomen pain?" If Yes, ask: "What does it feel like?" (e.g., crampy, dull, intermittent, constant)      no 6. ABDOMEN PAIN SEVERITY: If present, ask: "How bad is the pain?"  (e.g., Scale 1-10; mild, moderate, or severe)   - MILD (1-3): doesn't interfere with normal activities, abdomen soft and not tender to touch    - MODERATE (4-7): interferes with normal activities or awakens from sleep, abdomen tender to touch    - SEVERE (8-10): excruciating pain, doubled over, unable to do any normal activities       Cramping, bloating 7. ORAL INTAKE: If vomiting, "Have you been able to drink liquids?" "How much liquids have you had in the past 24 hours?"       8. HYDRATION: "Any signs of dehydration?" (e.g., dry mouth [not just dry lips], too weak to stand, dizziness, new weight loss) "When did you last urinate?"     No, normal urinating 9. EXPOSURE: "Have you traveled to a foreign country recently?" "Have you been exposed to anyone with  diarrhea?" "Could you have eaten any food that was spoiled?"       10. ANTIBIOTIC USE: "Are you taking antibiotics now or have you taken antibiotics in the past 2 months?"        Doxycycline- 3 weeks ago 11. OTHER SYMPTOMS: "Do you have any other symptoms?" (e.g., fever, blood in stool)       Belching, shoulder pain 12. PREGNANCY: "Is there any chance you are pregnant?" "When was your last menstrual period?"       *No Answer*  Protocols used: [facility]

## 2022-07-14 ENCOUNTER — Ambulatory Visit (INDEPENDENT_AMBULATORY_CARE_PROVIDER_SITE_OTHER): Payer: BC Managed Care – PPO | Admitting: Physician Assistant

## 2022-07-14 ENCOUNTER — Encounter: Payer: Self-pay | Admitting: Physician Assistant

## 2022-07-14 VITALS — BP 134/82 | HR 79 | Ht 62.0 in | Wt 223.5 lb

## 2022-07-14 DIAGNOSIS — R197 Diarrhea, unspecified: Secondary | ICD-10-CM

## 2022-07-14 DIAGNOSIS — R1013 Epigastric pain: Secondary | ICD-10-CM | POA: Diagnosis not present

## 2022-07-14 MED ORDER — SUCRALFATE 1 G PO TABS: 1.0000 g | ORAL_TABLET | Freq: Three times a day (TID) | ORAL | 0 refills | Status: DC

## 2022-07-14 NOTE — Progress Notes (Signed)
I,[name],acting as a Education administrator for [employer], PA-C.,have documented all relevant documentation on the behalf of [name], PA-C,as directed by  [name], PA-C while in the presence of [name], PA-C.   Established patient visit   Patient: Rachel Wells   DOB: 1971-02-15   50 y.o. Female  MRN: [id number] Visit Date: 07/14/2022  Today's healthcare provider: [name], PA-C   Cc. Abdominal pain w/ diarrhea x 2 weeks  Subjective    HPI   Patient reports abdominal pain radiating from the right upper quadrant/epigastric region to her left shoulder x2 weeks associated with diarrhea that started 7/21 continuing for 5 to 7 days.  As diarrhea was resolving had a migraine associated with nausea and vomiting.  Reports significant bloating continued abdominal pain.  Today and for the last 2 to 3 days no diarrhea nausea or vomiting pain is intermittent more prevalent now in the epigastric region with occasional pain in her right shoulder.  History of "gallbladder sludge years ago". history of fatty liver disease.  Reports when the symptoms started she was on vacation and was having fattier foods than usual, pizza, ice cream, hamburgers, sausage.  Since she has been home she has been eating a bland diet.  Over-the-counter she tried some Pepto-Bismol.  She has chronic heartburn and takes omeprazole in the morning she has not been consistent with this.  Describes her epigastric pain today as a burning pain.  Denies any associated sweating, dizziness, radiating pain on the left side. Denies chest pain, SOB, blood in BM.  Medications: Outpatient Medications Prior to Visit  Medication Sig   azelastine (ASTELIN) 0.1 % nasal spray Place 1-2 sprays into both nostrils 2 (two) times daily. Use in each nostril as directed   ELDERBERRY PO Take 1 tablet by mouth daily.   EPINEPHrine 0.3 mg/0.3 mL IJ SOAJ injection Inject 0.3 mg into the muscle as needed for anaphylaxis.   EQ  ALLERGY RELIEF, CETIRIZINE, 10 MG tablet Take 1 tablet by mouth once daily   guaiFENesin (MUCINEX) 600 MG 12 hr tablet Take 600 mg by mouth as needed.   hydrochlorothiazide (MICROZIDE) 12.5 MG capsule Take 1 capsule by mouth once daily   losartan (COZAAR) 100 MG tablet Take 1 tablet (100 mg total) by mouth daily.   metoprolol succinate (TOPROL-XL) 50 MG 24 hr tablet TAKE 1 TABLET BY MOUTH ONCE DAILY IMMEDIATELY FOLLOWING A MEAL   MULTIPLE VITAMIN PO Take 1 tablet by mouth daily.   omeprazole (PRILOSEC) 20 MG capsule Take 1 capsule by mouth once daily   Probiotic Product (ALIGN PO) Take 1 tablet by mouth 3 (three) times a week.   No facility-administered medications prior to visit.    Review of Systems  Constitutional:  Negative for fatigue and fever.  Respiratory:  Negative for cough and shortness of breath.   Cardiovascular:  Negative for chest pain and leg swelling.  Gastrointestinal:  Positive for diarrhea, nausea and vomiting. Negative for abdominal pain.  Neurological:  Negative for dizziness and headaches.       Objective    BP 134/82 (BP Location: Right Arm, Patient Position: Sitting, Cuff Size: Large)   Pulse 79   Ht '5\' 2"'$  (1.575 m)   Wt 223 lb 8 oz (101.4 kg)   LMP 09/08/2021 (Approximate)   SpO2 100%   BMI 40.88 kg/m  Blood pressure 134/82, pulse 79, height '5\' 2"'$  (1.575 m), weight 223 lb 8 oz (101.4 kg), last menstrual period 09/08/2021, SpO2 100 %.  Physical Exam Constitutional:      General: She is awake.     Appearance: She is well-developed.  HENT:     Head: Normocephalic.  Eyes:     Conjunctiva/sclera: Conjunctivae normal.  Cardiovascular:     Rate and Rhythm: Normal rate and regular rhythm.     Heart sounds: Normal heart sounds.  Pulmonary:     Effort: Pulmonary effort is normal.     Breath sounds: Normal breath sounds.  Abdominal:     General: There is no distension.     Palpations: Abdomen is soft.     Tenderness: There is abdominal tenderness in  the epigastric area. There is no guarding. Negative signs include Murphy's sign.  Skin:    General: Skin is warm.  Neurological:     Mental Status: She is alert and oriented to person, place, and time.  Psychiatric:        Attention and Perception: Attention normal.        Mood and Affect: Mood normal.        Speech: Speech normal.        Behavior: Behavior is cooperative.     No results found for any visits on 07/14/22.  Assessment & Plan     Problem List Items Addressed This Visit       Other   Epigastric abdominal pain - Primary    ddx gastritis vs pancreatitis vs gallbladder etiology Continue with bland diet, increase fluids Recommending omeprazole 20 mg in AM on empty stomach, do not eat for 20 minutes Adding carafate up to 4 times daily Cmp,cbc, lipase. EKG today to r/o abnormal presentation of MI -- advised ED precautions regarding pain EKG today normal sinus  Advised if all labwork normal and still symptomatic despite gastritis tx we can order abd ultrasound      Relevant Medications   sucralfate (CARAFATE) 1 g tablet   Other Relevant Orders   Comprehensive Metabolic Panel (CMET)   CBC w/Diff/Platelet   Lipase   EKG 12-Lead   Other Visit Diagnoses     Diarrhea, unspecified type       Relevant Orders   Comprehensive Metabolic Panel (CMET)   CBC w/Diff/Platelet   Lipase        Return if symptoms worsen or fail to improve.      I, [name], PA-C have reviewed all documentation for this visit. The documentation on  07/14/2022 for the exam, diagnosis, procedures, and orders are all accurate and complete.  [name], PA-C [facility] [address] [place], [place], [address] Office: [phone] Fax: [place]

## 2022-07-14 NOTE — Assessment & Plan Note (Addendum)
ddx gastritis vs pancreatitis vs gallbladder etiology Continue with bland diet, increase fluids Recommending omeprazole 20 mg in AM on empty stomach, do not eat for 20 minutes Adding carafate up to 4 times daily Cmp,cbc, lipase. EKG today to r/o abnormal presentation of MI -- advised ED precautions regarding pain EKG today normal sinus  Advised if all labwork normal and still symptomatic despite gastritis tx we can order abd ultrasound

## 2022-07-15 ENCOUNTER — Other Ambulatory Visit: Payer: Self-pay | Admitting: Physician Assistant

## 2022-07-15 DIAGNOSIS — R197 Diarrhea, unspecified: Secondary | ICD-10-CM

## 2022-07-15 DIAGNOSIS — R1013 Epigastric pain: Secondary | ICD-10-CM

## 2022-07-15 LAB — CBC WITH DIFFERENTIAL/PLATELET
Basophils Absolute: 0 10*3/uL (ref 0.0–0.2)
Basos: 1 %
EOS (ABSOLUTE): 0.2 10*3/uL (ref 0.0–0.4)
Eos: 3 %
Hematocrit: 40.7 % (ref 34.0–46.6)
Hemoglobin: 13.6 g/dL (ref 11.1–15.9)
Immature Grans (Abs): 0 10*3/uL (ref 0.0–0.1)
Immature Granulocytes: 0 %
Lymphocytes Absolute: 2.3 10*3/uL (ref 0.7–3.1)
Lymphs: 32 %
MCH: 28.5 pg (ref 26.6–33.0)
MCHC: 33.4 g/dL (ref 31.5–35.7)
MCV: 85 fL (ref 79–97)
Monocytes Absolute: 0.7 10*3/uL (ref 0.1–0.9)
Monocytes: 9 %
Neutrophils Absolute: 4 10*3/uL (ref 1.4–7.0)
Neutrophils: 55 %
Platelets: 311 10*3/uL (ref 150–450)
RBC: 4.77 x10E6/uL (ref 3.77–5.28)
RDW: 12.9 % (ref 11.7–15.4)
WBC: 7.2 10*3/uL (ref 3.4–10.8)

## 2022-07-15 LAB — COMPREHENSIVE METABOLIC PANEL
ALT: 15 IU/L (ref 0–32)
AST: 13 IU/L (ref 0–40)
Albumin/Globulin Ratio: 2 (ref 1.2–2.2)
Albumin: 4.5 g/dL (ref 3.9–4.9)
Alkaline Phosphatase: 99 IU/L (ref 44–121)
BUN/Creatinine Ratio: 13 (ref 9–23)
BUN: 12 mg/dL (ref 6–24)
Bilirubin Total: 0.4 mg/dL (ref 0.0–1.2)
CO2: 25 mmol/L (ref 20–29)
Calcium: 9.8 mg/dL (ref 8.7–10.2)
Chloride: 101 mmol/L (ref 96–106)
Creatinine, Ser: 0.9 mg/dL (ref 0.57–1.00)
Globulin, Total: 2.3 g/dL (ref 1.5–4.5)
Glucose: 89 mg/dL (ref 70–99)
Potassium: 4 mmol/L (ref 3.5–5.2)
Sodium: 142 mmol/L (ref 134–144)
Total Protein: 6.8 g/dL (ref 6.0–8.5)
eGFR: 78 mL/min/{1.73_m2} (ref >59)

## 2022-07-15 LAB — LIPASE: Lipase: 34 U/L (ref 14–72)

## 2022-07-16 ENCOUNTER — Ambulatory Visit
Admission: RE | Admit: 2022-07-16 | Discharge: 2022-07-16 | Disposition: A | Discharge status: Home / Self Care | Payer: BC Managed Care – PPO | Source: Ambulatory Visit | Attending: Physician Assistant | Admitting: Physician Assistant

## 2022-07-16 DIAGNOSIS — R197 Diarrhea, unspecified: Secondary | ICD-10-CM | POA: Diagnosis present

## 2022-07-16 DIAGNOSIS — R1013 Epigastric pain: Secondary | ICD-10-CM | POA: Diagnosis present

## 2022-08-09 ENCOUNTER — Other Ambulatory Visit: Payer: Self-pay | Admitting: Physician Assistant

## 2022-08-09 DIAGNOSIS — K219 Gastro-esophageal reflux disease without esophagitis: Secondary | ICD-10-CM

## 2022-08-13 ENCOUNTER — Other Ambulatory Visit: Payer: Self-pay | Admitting: Physician Assistant

## 2022-08-17 NOTE — Telephone Encounter (Signed)
Requested Prescriptions  Pending Prescriptions Disp Refills  . losartan (COZAAR) 100 MG tablet [Pharmacy Med Name: Losartan Potassium 100 MG Oral Tablet] 90 tablet 0    Sig: Take 1 tablet by mouth once daily     Cardiovascular:  Angiotensin Receptor Blockers Passed - 08/13/2022  9:13 AM      Passed - Cr in normal range and within 180 days    Creatinine  Date Value Ref Range Status  02/01/2014 0.90 0.60 - 1.30 mg/dL Final   Creatinine, Ser  Date Value Ref Range Status  07/14/2022 0.90 0.57 - 1.00 mg/dL Final         Passed - K in normal range and within 180 days    Potassium  Date Value Ref Range Status  07/14/2022 4.0 3.5 - 5.2 mmol/L Final  02/01/2014 3.5 3.5 - 5.1 mmol/L Final         Passed - Patient is not pregnant      Passed - Last BP in normal range    BP Readings from Last 1 Encounters:  07/14/22 134/82         Passed - Valid encounter within last 6 months    Recent Outpatient Visits          1 month ago Epigastric abdominal pain   [facility] [name], PA-C   4 months ago Primary hypertension   [facility] Thedore Mins, [place], PA-C   6 months ago Primary hypertension   [facility] Thedore Mins, [place], PA-C   9 months ago Encounter for physical examination   [employer], [place], PA-C   1 year ago Acute non-recurrent sinusitis, unspecified location   [facility], Scheryl Darter, NP

## 2022-09-05 ENCOUNTER — Other Ambulatory Visit: Payer: Self-pay | Admitting: Physician Assistant

## 2022-09-05 DIAGNOSIS — I10 Essential (primary) hypertension: Secondary | ICD-10-CM

## 2022-09-21 ENCOUNTER — Ambulatory Visit: Payer: BC Managed Care – PPO | Admitting: Dermatology

## 2022-11-01 ENCOUNTER — Telehealth: Payer: Self-pay | Admitting: Physician Assistant

## 2022-11-01 DIAGNOSIS — K219 Gastro-esophageal reflux disease without esophagitis: Secondary | ICD-10-CM

## 2022-11-01 MED ORDER — OMEPRAZOLE 20 MG PO CPDR
20.0000 mg | DELAYED_RELEASE_CAPSULE | Freq: Every day | ORAL | 1 refills | Status: DC
Start: 2022-11-01 — End: 2023-05-05

## 2022-11-01 NOTE — Telephone Encounter (Signed)
[facility] faxed refill request for the following medications:   omeprazole (PRILOSEC) 20 MG capsule    Please advise

## 2022-11-04 ENCOUNTER — Other Ambulatory Visit: Payer: Self-pay | Admitting: Physician Assistant

## 2022-11-04 DIAGNOSIS — I10 Essential (primary) hypertension: Secondary | ICD-10-CM

## 2022-11-05 NOTE — Telephone Encounter (Signed)
Requested Prescriptions  Pending Prescriptions Disp Refills   hydrochlorothiazide (MICROZIDE) 12.5 MG capsule [Pharmacy Med Name: hydroCHLOROthiazide 12.5 MG Oral Capsule] 90 capsule 0    Sig: Take 1 capsule by mouth once daily     Cardiovascular: Diuretics - Thiazide Passed - 11/04/2022  1:56 PM      Passed - Cr in normal range and within 180 days    Creatinine  Date Value Ref Range Status  02/01/2014 0.90 0.60 - 1.30 mg/dL Final   Creatinine, Ser  Date Value Ref Range Status  07/14/2022 0.90 0.57 - 1.00 mg/dL Final         Passed - K in normal range and within 180 days    Potassium  Date Value Ref Range Status  07/14/2022 4.0 3.5 - 5.2 mmol/L Final  02/01/2014 3.5 3.5 - 5.1 mmol/L Final         Passed - Na in normal range and within 180 days    Sodium  Date Value Ref Range Status  07/14/2022 142 134 - 144 mmol/L Final  02/01/2014 138 136 - 145 mmol/L Final         Passed - Last BP in normal range    BP Readings from Last 1 Encounters:  07/14/22 134/82         Passed - Valid encounter within last 6 months    Recent Outpatient Visits           3 months ago Epigastric abdominal pain   [facility] [name], PA-C   7 months ago Primary hypertension   [facility] Thedore Mins, [place], PA-C   8 months ago Primary hypertension   [facility] Thedore Mins, [place], PA-C   12 months ago Encounter for physical examination   [employer], [place], PA-C   1 year ago Acute non-recurrent sinusitis, unspecified location   [facility], Scheryl Darter, NP

## 2022-11-08 ENCOUNTER — Telehealth: Payer: Self-pay | Admitting: Physician Assistant

## 2022-12-18 ENCOUNTER — Other Ambulatory Visit: Payer: Self-pay | Admitting: Physician Assistant

## 2022-12-24 ENCOUNTER — Encounter: Payer: Self-pay | Admitting: Physician Assistant

## 2023-01-18 ENCOUNTER — Encounter: Payer: Self-pay | Admitting: Physician Assistant

## 2023-01-19 NOTE — Progress Notes (Unsigned)
     I,[name],acting as a Education administrator for [employer], PA-C.,have documented all relevant documentation on the behalf of [name], PA-C,as directed by  [name], PA-C while in the presence of [name], PA-C.   Established patient visit   Patient: Rachel Wells   DOB: Jun 23, 1971   52 y.o. Female  MRN: [id number] Visit Date: 01/20/2023  Today's healthcare provider: [name], PA-C   No chief complaint on file.  Subjective    HPI  ***  Medications: Outpatient Medications Prior to Visit  Medication Sig   azelastine (ASTELIN) 0.1 % nasal spray Place 1-2 sprays into both nostrils 2 (two) times daily. Use in each nostril as directed   ELDERBERRY PO Take 1 tablet by mouth daily.   EPINEPHrine 0.3 mg/0.3 mL IJ SOAJ injection Inject 0.3 mg into the muscle as needed for anaphylaxis.   EQ ALLERGY RELIEF, CETIRIZINE, 10 MG tablet Take 1 tablet by mouth once daily   guaiFENesin (MUCINEX) 600 MG 12 hr tablet Take 600 mg by mouth as needed.   hydrochlorothiazide (MICROZIDE) 12.5 MG capsule Take 1 capsule by mouth once daily   losartan (COZAAR) 100 MG tablet Take 1 tablet by mouth once daily   metoprolol succinate (TOPROL-XL) 50 MG 24 hr tablet TAKE 1 TABLET BY MOUTH ONCE DAILY IMMEDIATELY  FOLLOWING  A  MEAL   MULTIPLE VITAMIN PO Take 1 tablet by mouth daily.   omeprazole (PRILOSEC) 20 MG capsule Take 1 capsule (20 mg total) by mouth daily.   Probiotic Product (ALIGN PO) Take 1 tablet by mouth 3 (three) times a week.   sucralfate (CARAFATE) 1 g tablet Take 1 tablet (1 g total) by mouth 4 (four) times daily -  with meals and at bedtime for 14 days.   No facility-administered medications prior to visit.    Review of Systems  {Labs  Heme  Chem  Endocrine  Serology  Results Review (optional):23779}   Objective    LMP 09/08/2021 (Approximate)  {Show previous vital signs (optional):23777}  Physical Exam  ***  No results found for any visits on 01/20/23.   Assessment & Plan     ***  No follow-ups on file.      {provider attestation***:1}   [name], PA-C  [place] [phone] (phone) [phone] (fax)  [place]

## 2023-01-20 ENCOUNTER — Ambulatory Visit: Payer: BC Managed Care – PPO | Admitting: Physician Assistant

## 2023-01-20 ENCOUNTER — Encounter: Payer: Self-pay | Admitting: Physician Assistant

## 2023-01-20 VITALS — BP 146/88 | HR 82 | Ht 62.0 in | Wt 229.1 lb

## 2023-01-20 DIAGNOSIS — J309 Allergic rhinitis, unspecified: Secondary | ICD-10-CM

## 2023-01-20 DIAGNOSIS — R635 Abnormal weight gain: Secondary | ICD-10-CM | POA: Diagnosis not present

## 2023-01-20 DIAGNOSIS — R7303 Prediabetes: Secondary | ICD-10-CM | POA: Insufficient documentation

## 2023-01-20 DIAGNOSIS — R5383 Other fatigue: Secondary | ICD-10-CM

## 2023-01-20 MED ORDER — FEXOFENADINE HCL 180 MG PO TABS: 180.0000 mg | ORAL_TABLET | Freq: Every day | ORAL | 1 refills | Status: DC

## 2023-01-20 NOTE — Assessment & Plan Note (Signed)
Historically, will repeat A1c If pt is still pre-diabetic, advised starting metformin 500 mg XR for both glucose control and weight loss.

## 2023-01-20 NOTE — Assessment & Plan Note (Signed)
Will check tsh/t4 but likely 2/2 to sleeping schedule.

## 2023-01-20 NOTE — Assessment & Plan Note (Addendum)
Pt is not sleeping well and not exercising. Exercise would greatly improve her weight but pt needs to feel less fatigued first.  Will check tsh/t4, reviewed other recent labs. See prediabetes note.  We did briefly discuss other weight loss agents including GLP-1 agents. 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

## 2023-01-20 NOTE — Assessment & Plan Note (Signed)
Discussed switching zyrtec to allegra. May need to pull back on the azelatine-- causing dryness Continue other otc methods

## 2023-01-21 ENCOUNTER — Encounter: Payer: Self-pay | Admitting: Physician Assistant

## 2023-01-21 LAB — TSH+FREE T4
Free T4: 1.26 ng/dL (ref 0.82–1.77)
TSH: 1.4 u[IU]/mL (ref 0.450–4.500)

## 2023-01-21 LAB — HEMOGLOBIN A1C
Est. average glucose Bld gHb Est-mCnc: 114 mg/dL
Hgb A1c MFr Bld: 5.6 % (ref 4.8–5.6)

## 2023-01-30 ENCOUNTER — Other Ambulatory Visit: Payer: Self-pay | Admitting: Physician Assistant

## 2023-01-30 DIAGNOSIS — I10 Essential (primary) hypertension: Secondary | ICD-10-CM

## 2023-02-08 NOTE — Progress Notes (Unsigned)
I,Connie R Striblin,acting as a Education administrator for [employer], PA-C.,have documented all relevant documentation on the behalf of [name], PA-C,as directed by  [name], PA-C while in the presence of [name], PA-C.   Established patient visit   Patient: Rachel Wells   DOB: [birth year]   52 y.o. Female  MRN: [id number] Visit Date: 02/09/2023  Today's healthcare provider: [name], PA-C   Cc. cough  Subjective    HPI  Upper respiratory symptoms She complains of congestion, productive cough with  green colored sputum, and sinus pressure.with no fever, chills, night sweats or weight loss. Onset of symptoms was  10 days ago and worsening.She is drinking plenty of fluids.        Had cold and chest congestion since last Sunday 2/18. Chest congestion is getting worse. Mucus blowing out is clear coughing up is dark green . Took 2 Covid test both negative. Taking corcidin and Mucinex.  Denies significant SOB, wheezing, fevers.  Medications: Outpatient Medications Prior to Visit  Medication Sig   azelastine (ASTELIN) 0.1 % nasal spray Place 1-2 sprays into both nostrils 2 (two) times daily. Use in each nostril as directed   ELDERBERRY PO Take 1 tablet by mouth daily.   EPINEPHrine 0.3 mg/0.3 mL IJ SOAJ injection Inject 0.3 mg into the muscle as needed for anaphylaxis.   fexofenadine (ALLEGRA) 180 MG tablet Take 1 tablet (180 mg total) by mouth daily.   guaiFENesin (MUCINEX) 600 MG 12 hr tablet Take 600 mg by mouth as needed.   hydrochlorothiazide (MICROZIDE) 12.5 MG capsule Take 1 capsule by mouth once daily   losartan (COZAAR) 100 MG tablet Take 1 tablet by mouth once daily   metoprolol succinate (TOPROL-XL) 50 MG 24 hr tablet TAKE 1 TABLET BY MOUTH ONCE DAILY IMMEDIATELY  FOLLOWING  A  MEAL   MULTIPLE VITAMIN PO Take 1 tablet by mouth daily.   omeprazole (PRILOSEC) 20 MG capsule Take 1 capsule (20 mg total) by mouth daily.   Probiotic Product (ALIGN PO) Take 1  tablet by mouth 3 (three) times a week. (Patient not taking: Reported on 01/20/2023)   sucralfate (CARAFATE) 1 g tablet Take 1 tablet (1 g total) by mouth 4 (four) times daily -  with meals and at bedtime for 14 days.   No facility-administered medications prior to visit.    Review of Systems  Constitutional:  Negative for fatigue and fever.  Respiratory:  Negative for cough and shortness of breath.   Cardiovascular:  Negative for chest pain and leg swelling.  Gastrointestinal:  Negative for abdominal pain.  Neurological:  Negative for dizziness and headaches.      Objective    BP (!) 142/84 (BP Location: Right Arm, Patient Position: Sitting, Cuff Size: Large)   Pulse 84   Temp 98 F (36.7 C) (Oral)   Wt 233 lb (105.7 kg)   LMP 09/08/2021 (Approximate)   SpO2 99%   BMI 42.62 kg/m    Physical Exam Constitutional:      General: She is awake.     Appearance: She is well-developed.  HENT:     Head: Normocephalic.  Eyes:     Conjunctiva/sclera: Conjunctivae normal.  Cardiovascular:     Rate and Rhythm: Normal rate and regular rhythm.     Heart sounds: Normal heart sounds.  Pulmonary:     Effort: Pulmonary effort is normal.     Breath sounds: Normal breath sounds. No wheezing, rhonchi or rales.  Skin:  General: Skin is warm.  Neurological:     Mental Status: She is alert and oriented to person, place, and time.  Psychiatric:        Attention and Perception: Attention normal.        Mood and Affect: Mood normal.        Speech: Speech normal.        Behavior: Behavior is cooperative.      No results found for any visits on 02/09/23.  Assessment & Plan     Acute bronchitis Lungs cta Mucinex, corcidin, tessalon. Increase fluids Risk factors of obesity, htn Rx amoxicillin bid x 7 days - all to azithro, pt did not tolerate doxy previously Advised pt if no improvement would order chest xray Return if symptoms worsen or fail to improve.      I, [name],  PA-C have reviewed all documentation for this visit. The documentation on  02/09/23  for the exam, diagnosis, procedures, and orders are all accurate and complete.  [name], PA-C [facility] [address] [place], [place], [address] Office: [phone] Fax: [place]

## 2023-02-09 ENCOUNTER — Ambulatory Visit: Payer: BC Managed Care – PPO | Admitting: Physician Assistant

## 2023-02-09 ENCOUNTER — Encounter: Payer: Self-pay | Admitting: Physician Assistant

## 2023-02-09 VITALS — BP 142/84 | HR 84 | Temp 98.0°F | Wt 233.0 lb

## 2023-02-09 DIAGNOSIS — J208 Acute bronchitis due to other specified organisms: Secondary | ICD-10-CM | POA: Diagnosis not present

## 2023-02-09 DIAGNOSIS — B9689 Other specified bacterial agents as the cause of diseases classified elsewhere: Secondary | ICD-10-CM

## 2023-02-09 MED ORDER — AMOXICILLIN 875 MG PO TABS: 875.0000 mg | ORAL_TABLET | Freq: Two times a day (BID) | ORAL | 0 refills | Status: AC

## 2023-02-14 ENCOUNTER — Encounter: Payer: Self-pay | Admitting: Physician Assistant

## 2023-02-14 ENCOUNTER — Other Ambulatory Visit: Payer: Self-pay | Admitting: Physician Assistant

## 2023-02-14 MED ORDER — PREDNISONE 20 MG PO TABS: 20.0000 mg | ORAL_TABLET | Freq: Every day | ORAL | 0 refills | Status: DC

## 2023-02-23 ENCOUNTER — Other Ambulatory Visit: Payer: Self-pay | Admitting: Physician Assistant

## 2023-02-23 DIAGNOSIS — B9689 Other specified bacterial agents as the cause of diseases classified elsewhere: Secondary | ICD-10-CM

## 2023-02-24 ENCOUNTER — Ambulatory Visit
Admission: RE | Admit: 2023-02-24 | Discharge: 2023-02-24 | Disposition: A | Discharge status: Home / Self Care | Payer: BC Managed Care – PPO | Attending: Physician Assistant | Admitting: Physician Assistant

## 2023-02-24 ENCOUNTER — Other Ambulatory Visit: Payer: Self-pay | Admitting: Physician Assistant

## 2023-02-24 ENCOUNTER — Ambulatory Visit
Admission: RE | Admit: 2023-02-24 | Discharge: 2023-02-24 | Disposition: A | Discharge status: Home / Self Care | Payer: BC Managed Care – PPO | Source: Ambulatory Visit | Attending: Physician Assistant | Admitting: Physician Assistant

## 2023-02-24 DIAGNOSIS — B9689 Other specified bacterial agents as the cause of diseases classified elsewhere: Secondary | ICD-10-CM

## 2023-02-24 DIAGNOSIS — J208 Acute bronchitis due to other specified organisms: Secondary | ICD-10-CM | POA: Insufficient documentation

## 2023-02-24 MED ORDER — PULMICORT FLEXHALER 180 MCG/ACT IN AEPB: 1.0000 | INHALATION_SPRAY | Freq: Two times a day (BID) | RESPIRATORY_TRACT | 0 refills | Status: AC

## 2023-02-24 MED ORDER — ALBUTEROL SULFATE HFA 108 (90 BASE) MCG/ACT IN AERS: 2.0000 | INHALATION_SPRAY | Freq: Four times a day (QID) | RESPIRATORY_TRACT | 2 refills | Status: AC | PRN

## 2023-02-25 ENCOUNTER — Other Ambulatory Visit: Payer: Self-pay | Admitting: Physician Assistant

## 2023-02-25 DIAGNOSIS — Z1231 Encounter for screening mammogram for malignant neoplasm of breast: Secondary | ICD-10-CM

## 2023-03-16 ENCOUNTER — Ambulatory Visit
Admission: RE | Admit: 2023-03-16 | Discharge: 2023-03-16 | Disposition: A | Discharge status: Home / Self Care | Payer: BC Managed Care – PPO | Source: Ambulatory Visit | Attending: Physician Assistant | Admitting: Physician Assistant

## 2023-03-16 DIAGNOSIS — Z1231 Encounter for screening mammogram for malignant neoplasm of breast: Secondary | ICD-10-CM | POA: Diagnosis present

## 2023-03-23 ENCOUNTER — Other Ambulatory Visit: Payer: Self-pay | Admitting: Physician Assistant

## 2023-03-23 DIAGNOSIS — I10 Essential (primary) hypertension: Secondary | ICD-10-CM

## 2023-04-18 DIAGNOSIS — C4491 Basal cell carcinoma of skin, unspecified: Secondary | ICD-10-CM

## 2023-04-18 HISTORY — DX: Basal cell carcinoma of skin, unspecified: C44.91

## 2023-05-02 ENCOUNTER — Other Ambulatory Visit: Payer: Self-pay | Admitting: Physician Assistant

## 2023-05-02 DIAGNOSIS — I10 Essential (primary) hypertension: Secondary | ICD-10-CM

## 2023-05-05 ENCOUNTER — Other Ambulatory Visit: Payer: Self-pay | Admitting: Physician Assistant

## 2023-05-05 DIAGNOSIS — K219 Gastro-esophageal reflux disease without esophagitis: Secondary | ICD-10-CM

## 2023-05-20 ENCOUNTER — Encounter: Payer: Self-pay | Admitting: Dermatology

## 2023-06-01 ENCOUNTER — Ambulatory Visit (INDEPENDENT_AMBULATORY_CARE_PROVIDER_SITE_OTHER): Payer: BC Managed Care – PPO | Admitting: Dermatology

## 2023-06-01 ENCOUNTER — Encounter: Payer: Self-pay | Admitting: Dermatology

## 2023-06-01 VITALS — BP 151/87 | HR 101

## 2023-06-01 DIAGNOSIS — L820 Inflamed seborrheic keratosis: Secondary | ICD-10-CM

## 2023-06-01 DIAGNOSIS — L578 Other skin changes due to chronic exposure to nonionizing radiation: Secondary | ICD-10-CM

## 2023-06-01 DIAGNOSIS — L821 Other seborrheic keratosis: Secondary | ICD-10-CM | POA: Diagnosis not present

## 2023-06-01 DIAGNOSIS — C44519 Basal cell carcinoma of skin of other part of trunk: Secondary | ICD-10-CM

## 2023-06-01 DIAGNOSIS — W908XXA Exposure to other nonionizing radiation, initial encounter: Secondary | ICD-10-CM

## 2023-06-01 DIAGNOSIS — L719 Rosacea, unspecified: Secondary | ICD-10-CM

## 2023-06-01 MED ORDER — DOXYCYCLINE HYCLATE 80 MG PO TBEC: 1.0000 | DELAYED_RELEASE_TABLET | Freq: Every day | ORAL | 2 refills | Status: DC

## 2023-06-01 NOTE — Progress Notes (Signed)
New Patient Visit   Subjective  Rachel Wells is a 52 y.o. female who presents for the following: BCC on chest. Bx by Dr. Adolphus Birchwood. Has not been treated. Mid chest, superficial BCC. Bx: 04/18/2023. Here to establish care and treat BCC.  She also has a rough bump on chest that gets irritated.  Check rosacea. Face. Using a compound medication prescribed by Dr. Adolphus Birchwood. Azelaic acid with Ivermectin. States eyelids are itching. Wonders if has rosacea on eyelids. States she does have dry eyes. Doxycycline irritates stomach.   The following portions of the chart were reviewed this encounter and updated as appropriate: medications, allergies, medical history  Review of Systems:  No other skin or systemic complaints except as noted in HPI or Assessment and Plan.  Objective  Well appearing patient in no apparent distress; mood and affect are within normal limits.  A focused examination was performed of the following areas: Face, chest  Relevant exam findings are noted in the Assessment and Plan.  Mid Chest Pink bx site     mid upper chest x1 Erythematous keratotic or waxy stuck-on papule or plaque.    Assessment & Plan    ROSACEA Exam Mid face erythema with telangiectasias at nose, malar cheeks and chin. Mild erythema at eyelid margins. No crusting or scaling at eyelids.   Chronic and persistent condition with duration or expected duration over one year. Condition is symptomatic/ bothersome to patient. Not currently at goal.   Rosacea is a chronic progressive skin condition usually affecting the face of adults, causing redness and/or acne bumps. It is treatable but not curable. It sometimes affects the eyes (ocular rosacea) as well. It may respond to topical and/or systemic medication and can flare with stress, sun exposure, alcohol, exercise, topical steroids (including hydrocortisone/cortisone 10) and some foods.  Daily application of broad spectrum spf 30+ sunscreen to face is  recommended to reduce flares.  Patient C/O grittiness of the eyes and itching of eyelids.  Treatment Plan Start Doryx 80 mg 1/2 tablet daily with food.  Continue Ivermectin/Azelaic acid compound qd.   Recommend follow up with ophthalmologist for itching at eyelid margins if not improved with doxycycline.   Doxycycline should be taken with food to prevent nausea. Do not lay down for 30 minutes after taking. Be cautious with sun exposure and use good sun protection while on this medication. Pregnant women should not take this medication.   SEBORRHEIC KERATOSIS - Stuck-on, waxy, tan-brown papules and/or plaques  - Benign-appearing - Discussed benign etiology and prognosis. - Observe - Call for any changes  ACTINIC DAMAGE - chronic, secondary to cumulative UV radiation exposure/sun exposure over time - diffuse scaly erythematous macules with underlying dyspigmentation - Recommend daily broad spectrum sunscreen SPF 30+ to sun-exposed areas, reapply every 2 hours as needed.  - Recommend staying in the shade or wearing long sleeves, sun glasses (UVA+UVB protection) and wide brim hats (4-inch brim around the entire circumference of the hat). - Call for new or changing lesions.   Basal cell carcinoma (BCC) of skin of other part of torso Mid Chest  Destruction of lesion  Destruction method: electrodesiccation and curettage   Timeout:  patient name, date of birth, surgical site, and procedure verified Anesthesia: the lesion was anesthetized in a standard fashion   Anesthetic:  1% lidocaine w/ epinephrine 1-100,000 buffered w/ 8.4% NaHCO3 Curettage performed in three different directions: Yes   Electrodesiccation performed over the curetted area: Yes   Final wound size (cm):  0.9  Hemostasis achieved with:  pressure, aluminum chloride and electrodesiccation Outcome: patient tolerated procedure well with no complications   Post-procedure details: wound care instructions given   Additional  details:  Mupirocin ointment and Bandaid applied   Bx-proven superficial type.  Discussed BCC type of skin cancer and treatment options.   ED&C has about an 85% cure rate and leaves a round wound the size of the skin cancer which is healed with ointment and a bandage over a few weeks time. It leaves a round white scar. No additional pathology is done. If the skin cancer were to come back, we would need to do a surgery to remove it.   Excision involves cutting out the spot with an area of normal looking skin around it followed by closing it with stitches. The cure rate is approximately 92-93%. It leaves a line scar, and you must take it easy for two weeks after surgery (no lifting over 10-15 lbs, avoid activity to get your heart rate and blood pressure up). There is a slightly higher risk of infection, bleeding or the wound opening up with this compared to the scrape and burn.   Inflamed seborrheic keratosis mid upper chest x1  Symptomatic, irritating, patient would like treated.  Destruction of lesion - mid upper chest x1  Destruction method: cryotherapy   Informed consent: discussed and consent obtained   Lesion destroyed using liquid nitrogen: Yes   Region frozen until ice ball extended beyond lesion: Yes   Outcome: patient tolerated procedure well with no complications   Post-procedure details: wound care instructions given   Additional details:  Prior to procedure, discussed risks of blister formation, small wound, skin dyspigmentation, or rare scar following cryotherapy. Recommend Vaseline ointment to treated areas while healing.     Return in about 6 months (around 12/01/2023) for TBSE, HxBCC.  I, Lawson Radar, CMA, am acting as scribe for Willeen Niece, MD.   Documentation: I have reviewed the above documentation for accuracy and completeness, and I agree with the above.  Willeen Niece, MD

## 2023-06-01 NOTE — Patient Instructions (Addendum)
Start Doryx 80 mg 1/2 tablet daily with food.  Continue Ivermectin/Azelaic acid compound.   Recommend follow up with ophthalmologist for itching at eyelid margins.   Doxycycline should be taken with food to prevent nausea. Do not lay down for 30 minutes after taking. Be cautious with sun exposure and use good sun protection while on this medication. Pregnant women should not take this medication.     Your prescription was sent to [facility] in [place]. A representative from [employer] will contact you within 2 business hours to verify your address and insurance information to schedule a free delivery. If for any reason you do not receive a phone call from them, please reach out to them. Their phone number is [phone] and their hours are Monday-Friday 9:00 am-5:00 pm.       Cryotherapy Aftercare  Wash gently with soap and water everyday.   Apply Vaseline and Band-Aid daily until healed.      Wound Care Instructions  Cleanse wound gently with soap and water once a day then pat dry with clean gauze. Apply a thin coat of Petrolatum (petroleum jelly, "Vaseline") over the wound (unless you have an allergy to this). We recommend that you use a new, sterile tube of Vaseline. Do not pick or remove scabs. Do not remove the yellow or white "healing tissue" from the base of the wound.  Cover the wound with fresh, clean, nonstick gauze and secure with paper tape. You may use Band-Aids in place of gauze and tape if the wound is small enough, but would recommend trimming much of the tape off as there is often too much. Sometimes Band-Aids can irritate the skin.  You should call the office for your biopsy report after 1 week if you have not already been contacted.  If you experience any problems, such as abnormal amounts of bleeding, swelling, significant bruising, significant pain, or evidence of infection, please call the office immediately.  FOR ADULT SURGERY PATIENTS: If you need  something for pain relief you may take 1 extra strength Tylenol (acetaminophen) AND 2 Ibuprofen (200mg  each) together every 4 hours as needed for pain. (do not take these if you are allergic to them or if you have a reason you should not take them.) Typically, you may only need pain medication for 1 to 3 days.       Due to recent changes in healthcare laws, you may see results of your pathology and/or laboratory studies on MyChart before the doctors have had a chance to review them. We understand that in some cases there may be results that are confusing or concerning to you. Please understand that not all results are received at the same time and often the doctors may need to interpret multiple results in order to provide you with the best plan of care or course of treatment. Therefore, we ask that you please give [place] 2 business days to thoroughly review all your results before contacting the office for clarification. Should we see a critical lab result, you will be contacted sooner.   If You Need Anything After Your Visit  If you have any questions or concerns for your doctor, please call our main line at [phone] and press option 4 to reach your doctor's medical assistant. If no one answers, please leave a voicemail as directed and we will return your call as soon as possible. Messages left after 4 pm will be answered the following business day.   You may also send [place]  a message via MyChart. We typically respond to MyChart messages within 1-2 business days.  For prescription refills, please ask your pharmacy to contact our office. Our fax number is [phone].  If you have an urgent issue when the clinic is closed that cannot wait until the next business day, you can page your doctor at the number below.    Please note that while we do our best to be available for urgent issues outside of office hours, we are not available 24/7.   If you have an urgent issue and are unable to reach [place],  you may choose to seek medical care at your doctor's office, retail clinic, urgent care center, or emergency room.  If you have a medical emergency, please immediately call 911 or go to the emergency department.  Pager Numbers  - Dr. Gwen Pounds: [phone]  - Dr. Neale Burly: [phone]  - Dr. Roseanne Reno: [phone]  In the event of inclement weather, please call our main line at [phone] for an update on the status of any delays or closures.  Dermatology Medication Tips: Please keep the boxes that topical medications come in in order to help keep track of the instructions about where and how to use these. Pharmacies typically print the medication instructions only on the boxes and not directly on the medication tubes.   If your medication is too expensive, please contact our office at [phone] option 4 or send [place] a message through MyChart.   We are unable to tell what your co-pay for medications will be in advance as this is different depending on your insurance coverage. However, we may be able to find a substitute medication at lower cost or fill out paperwork to get insurance to cover a needed medication.   If a prior authorization is required to get your medication covered by your insurance company, please allow [place] 1-2 business days to complete this process.  Drug prices often vary depending on where the prescription is filled and some pharmacies may offer cheaper prices.  The website www.goodrx.com contains coupons for medications through different pharmacies. The prices here do not account for what the cost may be with help from insurance (it may be cheaper with your insurance), but the website can give you the price if you did not use any insurance.  - You can print the associated coupon and take it with your prescription to the pharmacy.  - You may also stop by our office during regular business hours and pick up a GoodRx coupon card.  - If you need your prescription sent  electronically to a different pharmacy, notify our office through [facility] or by phone at [phone] option 4.     Si Usted Necesita Algo Despus de Su Visita  Tambin puede enviarnos un mensaje a travs de Clinical cytogeneticist. Por lo general respondemos a los mensajes de MyChart en el transcurso de 1 a 2 das hbiles.  Para renovar recetas, por favor pida a su farmacia que se ponga en contacto con nuestra oficina. Annie Sable de fax es [place] [phone].  Si tiene un asunto urgente cuando la clnica est cerrada y que no puede esperar hasta el siguiente da hbil, puede llamar/localizar a su doctor(a) al nmero que aparece a continuacin.   Por favor, tenga en cuenta que aunque hacemos todo lo posible para estar disponibles para asuntos urgentes fuera del horario de [place], no estamos disponibles las 24 horas del da, los 7 [address]  [address] de la [place].   Si tiene un problema  urgente y no puede comunicarse con nosotros, puede optar por buscar atencin mdica  en el consultorio de su doctor(a), en una clnica privada, en un centro de atencin urgente o en una sala de emergencias.  Si tiene Engineer, drilling, por favor llame inmediatamente al 911 o vaya a la sala de emergencias.  Nmeros de bper  - Dr. Gwen Pounds: [phone]  - Dra. Moye: [phone]  - Dra. Roseanne Reno: [phone]  En caso de inclemencias del [place], por favor llame a Lacy Duverney principal al [phone] para una actualizacin sobre el [facility] de cualquier retraso o cierre.  Consejos para la medicacin en dermatologa: Por favor, guarde las cajas en las que vienen los medicamentos de uso tpico para ayudarle a seguir las instrucciones sobre dnde y cmo usarlos. Las farmacias generalmente imprimen las instrucciones del medicamento slo en las cajas y no directamente en los tubos del [place].   Si su medicamento es muy caro, por favor, pngase en contacto con Rolm Gala llamando al [phone] y presione la  opcin 4 o envenos un mensaje a travs de Clinical cytogeneticist.   No podemos decirle cul ser su copago por los medicamentos por adelantado ya que esto es diferente dependiendo de la cobertura de su seguro. Sin embargo, es posible que podamos encontrar un medicamento sustituto a Audiological scientist un formulario para que el seguro cubra el medicamento que se considera necesario.   Si se requiere una autorizacin previa para que su compaa de seguros [place] su medicamento, por favor permtanos de 1 a 2 das hbiles para completar [address]  Los precios de los medicamentos varan con frecuencia dependiendo del Environmental consultant de dnde se surte la receta y alguna farmacias pueden ofrecer precios ms baratos.  El sitio web www.goodrx.com tiene cupones para medicamentos de Health and safety inspector. Los precios aqu no tienen en cuenta lo que podra costar con la ayuda del seguro (puede ser ms barato con su seguro), pero el sitio web puede darle el precio si no utiliz Tourist information centre manager.  - Puede imprimir el cupn correspondiente y llevarlo con su receta a la farmacia.  - Tambin puede pasar por nuestra oficina durante el horario de atencin regular y Education officer, museum una tarjeta de cupones de GoodRx.  - Si necesita que su receta se enve electrnicamente a una farmacia diferente, informe a nuestra oficina a travs de MyChart de [place] o por telfono llamando al [phone] y presione la opcin 4.

## 2023-06-01 NOTE — Progress Notes (Deleted)
   New Patient Visit   Subjective  Rachel Wells is a 52 y.o. female who presents for the following: Skin Cancer Screening and Full Body Skin Exam  The patient presents for Total-Body Skin Exam (TBSE) for skin cancer screening and mole check. The patient has spots, moles and lesions to be evaluated, some may be new or changing and the patient has concerns that these could be cancer.    The following portions of the chart were reviewed this encounter and updated as appropriate: medications, allergies, medical history  Review of Systems:  No other skin or systemic complaints except as noted in HPI or Assessment and Plan.  Objective  Well appearing patient in no apparent distress; mood and affect are within normal limits.  A full examination was performed including scalp, head, eyes, ears, nose, lips, neck, chest, axillae, abdomen, back, buttocks, bilateral upper extremities, bilateral lower extremities, hands, feet, fingers, toes, fingernails, and toenails. All findings within normal limits unless otherwise noted below.   Relevant physical exam findings are noted in the Assessment and Plan.    Assessment & Plan   LENTIGINES, SEBORRHEIC KERATOSES, HEMANGIOMAS - Benign normal skin lesions - Benign-appearing - Call for any changes  MELANOCYTIC NEVI - Tan-brown and/or pink-flesh-colored symmetric macules and papules - Benign appearing on exam today - Observation - Call clinic for new or changing moles - Recommend daily use of broad spectrum spf 30+ sunscreen to sun-exposed areas.   ACTINIC DAMAGE - Chronic condition, secondary to cumulative UV/sun exposure - diffuse scaly erythematous macules with underlying dyspigmentation - Recommend daily broad spectrum sunscreen SPF 30+ to sun-exposed areas, reapply every 2 hours as needed.  - Staying in the shade or wearing long sleeves, sun glasses (UVA+UVB protection) and wide brim hats (4-inch brim around the entire circumference of the  hat) are also recommended for sun protection.  - Call for new or changing lesions.  SKIN CANCER SCREENING PERFORMED TODAY.      No follow-ups on file.  I, Lawson Radar, CMA, am acting as scribe for Willeen Niece, MD.   Documentation: I have reviewed the above documentation for accuracy and completeness, and I agree with the above.  Willeen Niece, MD

## 2023-06-08 ENCOUNTER — Encounter: Payer: Self-pay | Admitting: Physician Assistant

## 2023-06-16 ENCOUNTER — Other Ambulatory Visit: Payer: Self-pay | Admitting: Physician Assistant

## 2023-09-15 ENCOUNTER — Other Ambulatory Visit: Payer: Self-pay | Admitting: Physician Assistant

## 2023-09-15 DIAGNOSIS — I10 Essential (primary) hypertension: Secondary | ICD-10-CM

## 2023-10-02 ENCOUNTER — Other Ambulatory Visit: Payer: Self-pay | Admitting: Physician Assistant

## 2023-10-02 DIAGNOSIS — I10 Essential (primary) hypertension: Secondary | ICD-10-CM

## 2023-10-04 ENCOUNTER — Encounter: Payer: Self-pay | Admitting: Physician Assistant

## 2023-10-04 ENCOUNTER — Other Ambulatory Visit: Payer: Self-pay

## 2023-10-04 DIAGNOSIS — I10 Essential (primary) hypertension: Secondary | ICD-10-CM

## 2023-10-04 MED ORDER — METOPROLOL SUCCINATE ER 50 MG PO TB24
ORAL_TABLET | ORAL | 0 refills | Status: AC
Start: 2023-10-04 — End: ?

## 2023-11-29 ENCOUNTER — Encounter: Payer: Self-pay | Admitting: Dermatology

## 2023-11-29 ENCOUNTER — Ambulatory Visit: Payer: BC Managed Care – PPO | Admitting: Dermatology

## 2023-11-29 DIAGNOSIS — D485 Neoplasm of uncertain behavior of skin: Secondary | ICD-10-CM

## 2023-11-29 DIAGNOSIS — D225 Melanocytic nevi of trunk: Secondary | ICD-10-CM

## 2023-11-29 DIAGNOSIS — Z85828 Personal history of other malignant neoplasm of skin: Secondary | ICD-10-CM

## 2023-11-29 DIAGNOSIS — D492 Neoplasm of unspecified behavior of bone, soft tissue, and skin: Secondary | ICD-10-CM | POA: Diagnosis not present

## 2023-11-29 DIAGNOSIS — L814 Other melanin hyperpigmentation: Secondary | ICD-10-CM

## 2023-11-29 DIAGNOSIS — L820 Inflamed seborrheic keratosis: Secondary | ICD-10-CM

## 2023-11-29 DIAGNOSIS — D2272 Melanocytic nevi of left lower limb, including hip: Secondary | ICD-10-CM | POA: Diagnosis not present

## 2023-11-29 DIAGNOSIS — L821 Other seborrheic keratosis: Secondary | ICD-10-CM

## 2023-11-29 DIAGNOSIS — D2239 Melanocytic nevi of other parts of face: Secondary | ICD-10-CM

## 2023-11-29 DIAGNOSIS — W908XXA Exposure to other nonionizing radiation, initial encounter: Secondary | ICD-10-CM | POA: Diagnosis not present

## 2023-11-29 DIAGNOSIS — D229 Melanocytic nevi, unspecified: Secondary | ICD-10-CM

## 2023-11-29 DIAGNOSIS — L813 Cafe au lait spots: Secondary | ICD-10-CM

## 2023-11-29 DIAGNOSIS — Z1283 Encounter for screening for malignant neoplasm of skin: Secondary | ICD-10-CM

## 2023-11-29 DIAGNOSIS — L578 Other skin changes due to chronic exposure to nonionizing radiation: Secondary | ICD-10-CM

## 2023-11-29 DIAGNOSIS — D1801 Hemangioma of skin and subcutaneous tissue: Secondary | ICD-10-CM

## 2023-11-29 DIAGNOSIS — L719 Rosacea, unspecified: Secondary | ICD-10-CM

## 2023-11-29 DIAGNOSIS — D2271 Melanocytic nevi of right lower limb, including hip: Secondary | ICD-10-CM

## 2023-11-29 HISTORY — DX: Melanocytic nevi, unspecified: D22.9

## 2023-11-29 MED ORDER — DOXYCYCLINE HYCLATE 80 MG PO TBEC: 1.0000 | DELAYED_RELEASE_TABLET | Freq: Every day | ORAL | 5 refills | Status: DC

## 2023-11-29 NOTE — Progress Notes (Signed)
Follow-Up Visit   Subjective  Rachel Wells is a 52 y.o. female who presents for the following: Skin Cancer Screening and Full Body Skin Exam  The patient presents for Total-Body Skin Exam (TBSE) for skin cancer screening and mole check. The patient has spots, moles and lesions to be evaluated, some may be new or changing. She has a spot on her right zygoma, present x 2 months. History of BCC of the mid chest treated with [facility] 06/01/2023. She has rosacea of the face. She is using azelaic acid/ivermectin compound and takes Doryx 80 MG 1/2 tablet daily. Oral med improved skin, but no improvement with eyes. Eye doctor didn't see Rosacea of the eyes, and gave her an otc eye drop to use.  She has an irritated spot on her right wrist.   The following portions of the chart were reviewed this encounter and updated as appropriate: medications, allergies, medical history  Review of Systems:  No other skin or systemic complaints except as noted in HPI or Assessment and Plan.  Objective  Well appearing patient in no apparent distress; mood and affect are within normal limits.  A full examination was performed including scalp, head, eyes, ears, nose, lips, neck, chest, axillae, abdomen, back, buttocks, bilateral upper extremities, bilateral lower extremities, hands, feet, fingers, toes, fingernails, and toenails. All findings within normal limits unless otherwise noted below.   Relevant physical exam findings are noted in the Assessment and Plan.  Right Wrist x 1 Erythematous stuck-on, waxy papule or plaque Left calf 5.0 mm med dark brown macule, regular pigment   Assessment & Plan   SKIN CANCER SCREENING PERFORMED TODAY.  ACTINIC DAMAGE - Chronic condition, secondary to cumulative UV/sun exposure - diffuse scaly erythematous macules with underlying dyspigmentation - Recommend daily broad spectrum sunscreen SPF 30+ to sun-exposed areas, reapply every 2 hours as needed.  - Staying in the shade  or wearing long sleeves, sun glasses (UVA+UVB protection) and wide brim hats (4-inch brim around the entire circumference of the hat) are also recommended for sun protection.  - Call for new or changing lesions.  LENTIGINES, SEBORRHEIC KERATOSES, HEMANGIOMAS - Benign normal skin lesions - Benign-appearing - Call for any changes  MELANOCYTIC NEVI - Tan-brown and/or pink-flesh-colored symmetric macules and papules - 3.0 mm flesh brown papule at right zygoma - 6 mm speckled brown macule left upper back - 3.0 mm med dark brown macule R upper calf - Benign appearing on exam today - Observation - Call clinic for new or changing moles - Recommend daily use of broad spectrum spf 30+ sunscreen to sun-exposed areas.   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  - Call if any new or changing lesions are noted between office visits  ROSACEA Exam Mid face erythema with telangiectasias   Chronic and persistent condition with duration or expected duration over one year. Condition is improving with treatment but not currently at goal.   Rosacea is a chronic progressive skin condition usually affecting the face of adults, causing redness and/or acne bumps. It is treatable but not curable. It sometimes affects the eyes (ocular rosacea) as well. It may respond to topical and/or systemic medication and  can flare with stress, sun exposure, alcohol, exercise, topical steroids (including hydrocortisone/cortisone 10) and some foods.  Daily application of broad spectrum spf 30+ sunscreen to face is recommended to reduce flares.  Patient with grittiness of the eyes, not related to rosacea per  ophthalmologist. Continue otc eye drops as prescribed by eye dr.   Treatment Plan Continue Doryx 80 mg 1/2 tablet daily with food.   Continue Ivermectin/Azelaic acid compound qd.   Samples of [employer] sunscreens given  Doxycycline should be taken with food to prevent nausea. Do not lay down for 30 minutes after taking. Be cautious with sun exposure and use good sun protection while on this medication. Pregnant women should not take this medication.    INFLAMED SEBORRHEIC KERATOSIS Right Wrist x 1 Symptomatic, irritating, patient would like treated. Destruction of lesion - Right Wrist x 1  Destruction method: cryotherapy   Informed consent: discussed and consent obtained   Lesion destroyed using liquid nitrogen: Yes   Region frozen until ice ball extended beyond lesion: Yes   Outcome: patient tolerated procedure well with no complications   Post-procedure details: wound care instructions given   Additional details:  Prior to procedure, discussed risks of blister formation, small wound, skin dyspigmentation, or rare scar following cryotherapy. Recommend Vaseline ointment to treated areas while healing.  NEOPLASM OF UNCERTAIN BEHAVIOR OF SKIN Left calf Epidermal / dermal shaving  Lesion diameter (cm):  0.8 Informed consent: discussed and consent obtained   Patient was prepped and draped in usual sterile fashion: Area prepped with alcohol. Anesthesia: the lesion was anesthetized in a standard fashion   Anesthetic:  1% lidocaine w/ epinephrine 1-100,000 buffered w/ 8.4% NaHCO3 Instrument used: flexible razor blade   Hemostasis achieved with: pressure, aluminum chloride and electrodesiccation   Outcome: patient tolerated procedure well   Post-procedure details: wound care instructions given   Post-procedure details comment:  Ointment and small bandage applied Specimen 1 - Surgical pathology Differential Diagnosis: Nevus r/o Dysplasia  Check Margins: Yes Unknown to patient how long  present.  Return in about 1 year (around 11/28/2024) for TBSE, Hx BCC.  ICherlyn Labella, CMA, am acting as scribe for Willeen Niece, MD .   Documentation: I have reviewed the above documentation for accuracy and completeness, and I agree with the above.  Willeen Niece, MD

## 2023-11-29 NOTE — Patient Instructions (Addendum)

## 2023-11-29 NOTE — Progress Notes (Deleted)
   Follow-Up Visit   Subjective  Rachel Wells is a 52 y.o. female who presents for the following: Skin Cancer Screening and Full Body Skin Exam  The patient presents for Total-Body Skin Exam (TBSE) for skin cancer screening and mole check. The patient has spots, moles and lesions to be evaluated, some may be new or changing and the patient may have concern these could be cancer.    The following portions of the chart were reviewed this encounter and updated as appropriate: medications, allergies, medical history  Review of Systems:  No other skin or systemic complaints except as noted in HPI or Assessment and Plan.  Objective  Well appearing patient in no apparent distress; mood and affect are within normal limits.  A full examination was performed including scalp, head, eyes, ears, nose, lips, neck, chest, axillae, abdomen, back, buttocks, bilateral upper extremities, bilateral lower extremities, hands, feet, fingers, toes, fingernails, and toenails. All findings within normal limits unless otherwise noted below.   Relevant physical exam findings are noted in the Assessment and Plan.    Assessment & Plan   SKIN CANCER SCREENING PERFORMED TODAY.  ACTINIC DAMAGE - Chronic condition, secondary to cumulative UV/sun exposure - diffuse scaly erythematous macules with underlying dyspigmentation - Recommend daily broad spectrum sunscreen SPF 30+ to sun-exposed areas, reapply every 2 hours as needed.  - Staying in the shade or wearing long sleeves, sun glasses (UVA+UVB protection) and wide brim hats (4-inch brim around the entire circumference of the hat) are also recommended for sun protection.  - Call for new or changing lesions.  LENTIGINES, SEBORRHEIC KERATOSES, HEMANGIOMAS - Benign normal skin lesions - Benign-appearing - Call for any changes  MELANOCYTIC NEVI - Tan-brown and/or pink-flesh-colored symmetric macules and papules - Benign appearing on exam today - Observation -  Call clinic for new or changing moles - Recommend daily use of broad spectrum spf 30+ sunscreen to sun-exposed areas.        No follow-ups on file.    Documentation: I have reviewed the above documentation for accuracy and completeness, and I agree with the above.  Willeen Niece, MD

## 2023-12-06 LAB — SURGICAL PATHOLOGY

## 2023-12-15 ENCOUNTER — Telehealth: Payer: Self-pay

## 2023-12-15 NOTE — Telephone Encounter (Signed)
-----   Message from Willeen Niece sent at 12/13/2023 11:59 AM EST ----- 1. Skin, left calf :       ATYPICAL PIGMENTED SPINDLE CELL NEVUS, MARGIN CLOSE, SEE DESCRIPTION   Atypical mole- needs excision - please call patient

## 2023-12-19 ENCOUNTER — Telehealth: Payer: Self-pay

## 2023-12-19 NOTE — Telephone Encounter (Signed)
-----   Message from Willeen Niece sent at 12/13/2023 11:59 AM EST ----- 1. Skin, left calf :       ATYPICAL PIGMENTED SPINDLE CELL NEVUS, MARGIN CLOSE, SEE DESCRIPTION   Atypical mole- needs excision - please call patient

## 2023-12-19 NOTE — Telephone Encounter (Signed)
 Left pt msg to call for bx result/sh

## 2023-12-21 ENCOUNTER — Telehealth: Payer: Self-pay

## 2023-12-21 NOTE — Telephone Encounter (Signed)
-----   Message from Willeen Niece sent at 12/13/2023 11:59 AM EST ----- 1. Skin, left calf :       ATYPICAL PIGMENTED SPINDLE CELL NEVUS, MARGIN CLOSE, SEE DESCRIPTION   Atypical mole- needs excision - please call patient

## 2023-12-21 NOTE — Telephone Encounter (Signed)
 Patient informed of pathology results and surgery scheduled.

## 2023-12-21 NOTE — Telephone Encounter (Signed)
 Left pt msg to call for bx result/sh

## 2024-01-30 ENCOUNTER — Encounter: Payer: Self-pay | Admitting: Dermatology

## 2024-02-13 ENCOUNTER — Ambulatory Visit (INDEPENDENT_AMBULATORY_CARE_PROVIDER_SITE_OTHER): Payer: 59 | Admitting: Dermatology

## 2024-02-13 ENCOUNTER — Encounter: Payer: Self-pay | Admitting: Dermatology

## 2024-02-13 DIAGNOSIS — D492 Neoplasm of unspecified behavior of bone, soft tissue, and skin: Secondary | ICD-10-CM

## 2024-02-13 DIAGNOSIS — D2272 Melanocytic nevi of left lower limb, including hip: Secondary | ICD-10-CM

## 2024-02-13 DIAGNOSIS — L814 Other melanin hyperpigmentation: Secondary | ICD-10-CM

## 2024-02-13 NOTE — Patient Instructions (Addendum)

## 2024-02-13 NOTE — Progress Notes (Signed)
   Follow-Up Visit   Subjective  Rachel Wells is a 53 y.o. 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   The following portions of the chart were reviewed this encounter and updated as appropriate: medications, allergies, medical history  Review of Systems:  No other skin or systemic complaints except as noted in HPI or Assessment and Plan.  Objective  Well appearing patient in no apparent distress; mood and affect are within normal limits.   A focused examination was performed of the following areas: Left leg  Relevant exam findings are noted in the Assessment and Plan.  L calf violaceous macule 9.0 x 7.63mm, appears clear today without recurrence of pigment  Assessment & Plan     NEOPLASM OF SKIN L calf Epidermal / dermal shaving  Lesion diameter (cm):  1 Informed consent: discussed and consent obtained   Patient was prepped and draped in usual sterile fashion: area prepped with alcohol. Anesthesia: the lesion was anesthetized in a standard fashion   Anesthetic:  1% lidocaine w/ epinephrine 1-100,000 buffered w/ 8.4% NaHCO3 Instrument used: flexible razor blade   Hemostasis achieved with: pressure, aluminum chloride and electrodesiccation   Outcome: patient tolerated procedure well   Post-procedure details: wound care instructions given   Post-procedure details comment:  Ointment and small bandage applied Specimen 1 - Surgical pathology Differential Diagnosis: Bx proven ATYPICAL PIGMENTED SPINDLE CELL NEVUS  Check Margins: yes Hyperpigmented macule 9.0 x 7.27mm appears clear today Shave removal today [phone] 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 Recommend  vaseline ointment to area daily and cover until healed.  Recommend photoprotection/sunscreen to area to prevent discoloration of scar.  Once healed, may apply OTC Serica scar gel bid to thickened scars.   LENTIGINES Exam: scattered tan macules legs  Due to sun exposure Treatment Plan: Benign-appearing, observe. Recommend daily broad spectrum sunscreen SPF 30+ to sun-exposed areas, reapply every 2 hours as needed.  Call for any changes    Return in about 6 months (around 08/15/2024) for recheck Atypical Spindle cell nevus L calf.  I, Ardis Rowan, RMA, am acting as scribe for Willeen Niece, MD .   Documentation: I have reviewed the above documentation for accuracy and completeness, and I agree with the above.  Willeen Niece, MD

## 2024-02-16 LAB — SURGICAL PATHOLOGY

## 2024-02-20 ENCOUNTER — Telehealth: Payer: Self-pay

## 2024-02-20 NOTE — Telephone Encounter (Signed)
-----   Message from Willeen Niece sent at 02/20/2024 10:26 AM EDT ----- 1. Skin, L calf :       CHANGES CONSISTENT WITH PREVIOUS PROCEDURE, NO RESIDUAL LESION, MARGINS FREE   No residual atypical mole, margins free - please call patient

## 2024-02-20 NOTE — Telephone Encounter (Signed)
 Advised patient no residual atypical mole of the left calf - margins free. Patient to keep f/u appt.

## 2024-03-08 ENCOUNTER — Other Ambulatory Visit: Payer: Self-pay | Admitting: Internal Medicine

## 2024-03-08 DIAGNOSIS — Z1231 Encounter for screening mammogram for malignant neoplasm of breast: Secondary | ICD-10-CM

## 2024-03-22 ENCOUNTER — Ambulatory Visit
Admission: RE | Admit: 2024-03-22 | Discharge: 2024-03-22 | Disposition: A | Discharge status: Home / Self Care | Source: Ambulatory Visit | Attending: Internal Medicine | Admitting: Internal Medicine

## 2024-03-22 DIAGNOSIS — Z1231 Encounter for screening mammogram for malignant neoplasm of breast: Secondary | ICD-10-CM | POA: Diagnosis present

## 2024-04-16 ENCOUNTER — Ambulatory Visit: Payer: BC Managed Care – PPO | Admitting: Dermatology

## 2024-04-25 ENCOUNTER — Other Ambulatory Visit: Payer: Self-pay | Admitting: Physician Assistant

## 2024-04-25 DIAGNOSIS — K219 Gastro-esophageal reflux disease without esophagitis: Secondary | ICD-10-CM

## 2024-05-29 ENCOUNTER — Ambulatory Visit: Payer: BC Managed Care – PPO | Admitting: Dermatology

## 2024-06-12 ENCOUNTER — Other Ambulatory Visit: Payer: Self-pay | Admitting: Physician Assistant

## 2024-08-14 ENCOUNTER — Ambulatory Visit (INDEPENDENT_AMBULATORY_CARE_PROVIDER_SITE_OTHER): Admitting: Dermatology

## 2024-08-14 DIAGNOSIS — Z86018 Personal history of other benign neoplasm: Secondary | ICD-10-CM

## 2024-08-14 DIAGNOSIS — B070 Plantar wart: Secondary | ICD-10-CM

## 2024-08-14 DIAGNOSIS — L578 Other skin changes due to chronic exposure to nonionizing radiation: Secondary | ICD-10-CM | POA: Diagnosis not present

## 2024-08-14 DIAGNOSIS — D229 Melanocytic nevi, unspecified: Secondary | ICD-10-CM

## 2024-08-14 DIAGNOSIS — L570 Actinic keratosis: Secondary | ICD-10-CM | POA: Diagnosis not present

## 2024-08-14 DIAGNOSIS — D225 Melanocytic nevi of trunk: Secondary | ICD-10-CM | POA: Diagnosis not present

## 2024-08-14 DIAGNOSIS — W908XXA Exposure to other nonionizing radiation, initial encounter: Secondary | ICD-10-CM

## 2024-08-14 DIAGNOSIS — Z7189 Other specified counseling: Secondary | ICD-10-CM | POA: Diagnosis not present

## 2024-08-14 NOTE — Patient Instructions (Addendum)
 Cryotherapy Aftercare  Wash gently with soap and water everyday.   Apply Vaseline and Band-Aid daily until healed.    Recommend using Curad Mediplast pads. Cut to fit wart or callus. Cover with Elastoplast waterproof tape or any waterproof band-aid. Change every 3 to 4 days, or sooner if necessary.  Treatment may require several months of regular use before results are seen.  Viral Warts & Molluscum Contagiosum  Viral warts and molluscum contagiosum are growths of the skin caused by viral infection of the skin. If you have been given the diagnosis of viral warts or molluscum contagiosum there are a few things that you must understand about your condition:  There is no guaranteed treatment method available for this condition. Multiple treatments may be required, The treatments may be time consuming and require multiple visits to the dermatology office. The treatment may be expensive. You will be charged each time you come into the office to have the spots treated. The treated areas may develop new lesions further complicating treatment. The treated areas may leave a scar. There is no guarantee that even after multiple treatments that the spots will be successfully treated. These are caused by a viral infection and can be spread to other areas of the skin and to other people by direct contact. Therefore, new spots may occur.   Due to recent changes in healthcare laws, you may see results of your pathology and/or laboratory studies on MyChart before the doctors have had a chance to review them. We understand that in some cases there may be results that are confusing or concerning to you. Please understand that not all results are received at the same time and often the doctors may need to interpret multiple results in order to provide you with the best plan of care or course of treatment. Therefore, we ask that you please give us  2 business days to thoroughly review all your results before  contacting the office for clarification. Should we see a critical lab result, you will be contacted sooner.    If You Need Anything After Your Visit  If you have any questions or concerns for your doctor, please call our main line at [phone] and press option 4 to reach your doctor's medical assistant. If no one answers, please leave a voicemail as directed and we will return your call as soon as possible. Messages left after 4 pm will be answered the following business day.   You may also send us  a message via MyChart. We typically respond to MyChart messages within 1-2 business days.  For prescription refills, please ask your pharmacy to contact our office. Our fax number is [phone].  If you have an urgent issue when the clinic is closed that cannot wait until the next business day, you can page your doctor at the number below.    Please note that while we do our best to be available for urgent issues outside of office hours, we are not available 24/7.   If you have an urgent issue and are unable to reach us , you may choose to seek medical care at your doctor's office, retail clinic, urgent care center, or emergency room.  If you have a medical emergency, please immediately call 911 or go to the emergency department.  Pager Numbers  - Dr. Hester: [phone]  - Dr. Jackquline: [phone]  - Dr. Claudene: [phone]   - Dr. Raymund: [phone]  In the event of inclement weather, please call our main line at [phone] for  an update on the status of any delays or closures.  Dermatology Medication Tips: Please keep the boxes that topical medications come in in order to help keep track of the instructions about where and how to use these. Pharmacies typically print the medication instructions only on the boxes and not directly on the medication tubes.   If your medication is too expensive, please contact our office at [phone] option 4 or send us  a message through  MyChart.   We are unable to tell what your co-pay for medications will be in advance as this is different depending on your insurance coverage. However, we may be able to find a substitute medication at lower cost or fill out paperwork to get insurance to cover a needed medication.   If a prior authorization is required to get your medication covered by your insurance company, please allow us  1-2 business days to complete this process.  Drug prices often vary depending on where the prescription is filled and some pharmacies may offer cheaper prices.  The website www.goodrx.com contains coupons for medications through different pharmacies. The prices here do not account for what the cost may be with help from insurance (it may be cheaper with your insurance), but the website can give you the price if you did not use any insurance.  - You can print the associated coupon and take it with your prescription to the pharmacy.  - You may also stop by our office during regular business hours and pick up a GoodRx coupon card.  - If you need your prescription sent electronically to a different pharmacy, notify our office through [facility] or by phone at [phone] option 4.     Si Usted Necesita Algo Despus de Su Visita  Tambin puede enviarnos un mensaje a travs de Clinical cytogeneticist. Por lo general respondemos a los mensajes de MyChart en el transcurso de 1 a 2 das hbiles.  Para renovar recetas, por favor pida a su farmacia que se ponga en contacto con nuestra oficina. Randi lakes de fax es [place] [phone].  Si tiene un asunto urgente cuando la clnica est cerrada y que no puede esperar hasta el siguiente da hbil, puede llamar/localizar a su doctor(a) al nmero que aparece a continuacin.   Por favor, tenga en cuenta que aunque hacemos todo lo posible para estar disponibles para asuntos urgentes fuera del horario de [place], no estamos disponibles las 24 horas del da, los 7 [address]  [address] de la  [place].   Si tiene un problema urgente y no puede comunicarse con nosotros, puede optar por buscar atencin mdica  en el consultorio de su doctor(a), en una clnica privada, en un centro de atencin urgente o en una sala de emergencias.  Si tiene Engineer, drilling, por favor llame inmediatamente al 911 o vaya a la sala de emergencias.  Nmeros de bper  - Dr. Hester: [phone]  - Dra. Jackquline: [phone]  - Dr. Claudene: [phone]  - Dra. Kitts: [phone]  En caso de inclemencias del [place], por favor llame a nuestra lnea principal al [phone] para una actualizacin sobre el estado de cualquier retraso o cierre.  Consejos para la medicacin en dermatologa: Por favor, guarde las cajas en las que vienen los medicamentos de uso tpico para ayudarle a seguir las instrucciones sobre dnde y cmo usarlos. Las farmacias generalmente imprimen las instrucciones del medicamento slo en las cajas y no directamente en los tubos del [place].   Si su medicamento es [address], por favor, pngase  en contacto con nuestra oficina llamando al [phone] y presione la opcin 4 o envenos un mensaje a travs de Clinical cytogeneticist.   No podemos decirle cul ser su copago por los medicamentos por adelantado ya que esto es diferente dependiendo de la cobertura de su seguro. Sin embargo, es posible que podamos encontrar un medicamento sustituto a Audiological scientist un formulario para que el seguro cubra el medicamento que se considera necesario.   Si se requiere una autorizacin previa para que su compaa de seguros malta su medicamento, por favor permtanos de 1 a 2 das hbiles para completar este proceso.  Los precios de los medicamentos varan con frecuencia dependiendo del Environmental consultant de dnde se surte la receta y alguna farmacias pueden ofrecer precios ms baratos.  El sitio web www.goodrx.com tiene cupones para medicamentos de Health and safety inspector. Los precios aqu no tienen en cuenta lo que  podra costar con la ayuda del seguro (puede ser ms barato con su seguro), pero el sitio web puede darle el precio si no utiliz Tourist information centre manager.  - Puede imprimir el cupn correspondiente y llevarlo con su receta a la farmacia.  - Tambin puede pasar por nuestra oficina durante el horario de atencin regular y Education officer, museum una tarjeta de cupones de GoodRx.  - Si necesita que su receta se enve electrnicamente a una farmacia diferente, informe a nuestra oficina a travs de MyChart de [place] o por telfono llamando al [phone] y presione la opcin 4.

## 2024-08-14 NOTE — Progress Notes (Signed)
 Follow-Up Visit   Subjective  Bryan Omura is a 53 y.o. female who presents for the following: 6 month follow-up Atypical Pigmented Spindle Cell Nevus at the left calf.  She also has a wart on the right plantar foot, present x 2-3 years. She has a pink spot on the right wrist x 1 year, scabs, never goes away, growing, not itchy or painful.   The patient has spots, moles and lesions to be evaluated, some may be new or changing.   The following portions of the chart were reviewed this encounter and updated as appropriate: medications, allergies, medical history  Review of Systems:  No other skin or systemic complaints except as noted in HPI or Assessment and Plan.  Objective  Well appearing patient in no apparent distress; mood and affect are within normal limits.  A focused examination was performed of the following areas: Face, legs, feet, arm  Relevant physical exam findings are noted in the Assessment and Plan.  R wrist x 1 Pink scaly macule.  Assessment & Plan  ACTINIC DAMAGE - chronic, secondary to cumulative UV radiation exposure/sun exposure over time - diffuse scaly erythematous macules with underlying dyspigmentation - Recommend daily broad spectrum sunscreen SPF 30+ to sun-exposed areas, reapply every 2 hours as needed.  - Recommend staying in the shade or wearing long sleeves, sun glasses (UVA+UVB protection) and wide brim hats (4-inch brim around the entire circumference of the hat). - Call for new or changing lesions.  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  - Call if any new or changing lesions are noted between office visits  MELANOCYTIC NEVI Exam: Tan-brown and/or pink-flesh-colored symmetric macules and papules.  - Left  upper breast with 2 mm flesh papule with adjacent 2 mm tan macule  Treatment Plan: Benign appearing on exam today. Recommend observation. Call clinic for new or changing moles. Recommend daily use of broad spectrum spf 30+ sunscreen to sun-exposed areas.   PLANTAR WART Exam: R plantar foot at ball with 4 mm firm verrucous papule  Counseling Discussed viral / HPV (Human Papilloma Virus) etiology and risk of spread /infectivity to other areas of body as well as to other people.  Multiple treatments and methods may be required to clear warts and it is possible treatment may not be successful.  Treatment risks include discoloration; scarring and there is still potential for wart recurrence.  Treatment Plan: Destruction Procedure Note Destruction method: cryotherapy   Informed consent: discussed and consent obtained   Lesion destroyed using liquid nitrogen: Yes   Outcome: patient tolerated procedure well with no complications   Post-procedure details: wound care instructions given   Locations: left plantar foot at ball # of Lesions Treated: 1  Prior to procedure, discussed risks of blister formation, small wound, skin dyspigmentation, or rare scar following cryotherapy. Recommend Vaseline ointment to treated areas while healing.  Recommend using Curad Mediplast pads. Cut to fit wart or callus. Cover with Elastoplast waterproof tape or any waterproof band-aid. Change every 3 to 4 days, or sooner if necessary.  Treatment may require several months of regular use before results are seen.   AK (ACTINIC KERATOSIS) R wrist x 1 vs Inflamed SK  Actinic keratoses are precancerous spots that appear secondary to cumulative UV radiation exposure/sun exposure over  time. They are chronic with expected duration over 1 year. A portion of actinic keratoses will progress to squamous cell carcinoma of the skin. It is not possible to reliably predict which spots will progress to skin cancer and so treatment is  recommended to prevent development of skin cancer.  Recommend daily broad spectrum sunscreen SPF 30+ to sun-exposed areas, reapply every 2 hours as needed.  Recommend staying in the shade or wearing long sleeves, sun glasses (UVA+UVB protection) and wide brim hats (4-inch brim around the entire circumference of the hat). Call for new or changing lesions. Destruction of lesion - R wrist x 1  Destruction method: cryotherapy   Informed consent: discussed and consent obtained   Lesion destroyed using liquid nitrogen: Yes   Region frozen until ice ball extended beyond lesion: Yes   Outcome: patient tolerated procedure well with no complications   Post-procedure details: wound care instructions given   Additional details:  Prior to procedure, discussed risks of blister formation, small wound, skin dyspigmentation, or rare scar following cryotherapy. Recommend Vaseline ointment to treated areas while healing.     Return as scheduled, for TBSE.  IAndrea Kerns, CMA, am acting as scribe for Rexene Rattler, MD .   Documentation: I have reviewed the above documentation for accuracy and completeness, and I agree with the above.  Rexene Rattler, MD

## 2024-08-23 ENCOUNTER — Telehealth: Admitting: Physician Assistant

## 2024-08-23 DIAGNOSIS — J019 Acute sinusitis, unspecified: Secondary | ICD-10-CM

## 2024-08-23 DIAGNOSIS — B9689 Other specified bacterial agents as the cause of diseases classified elsewhere: Secondary | ICD-10-CM | POA: Diagnosis not present

## 2024-08-23 MED ORDER — AMOXICILLIN 875 MG PO TABS: 875.0000 mg | ORAL_TABLET | Freq: Two times a day (BID) | ORAL | 0 refills | Status: AC

## 2024-08-23 NOTE — Progress Notes (Signed)

## 2024-10-17 ENCOUNTER — Telehealth: Payer: Self-pay

## 2024-10-17 NOTE — Telephone Encounter (Signed)
 [facility] called regarding patients Doryx  80 MG. This medication is no longer available and pharmacy requesting alternative.

## 2024-10-22 ENCOUNTER — Encounter: Payer: Self-pay | Admitting: Dermatology

## 2024-10-24 MED ORDER — DOXYCYCLINE HYCLATE 20 MG PO TABS: 20.0000 mg | ORAL_TABLET | Freq: Two times a day (BID) | ORAL | 1 refills | Status: DC

## 2024-11-13 ENCOUNTER — Encounter: Payer: Self-pay | Admitting: Dermatology

## 2024-11-14 ENCOUNTER — Other Ambulatory Visit: Payer: Self-pay

## 2024-11-14 MED ORDER — DOXYCYCLINE 40 MG PO CPDR: 40.0000 mg | DELAYED_RELEASE_CAPSULE | ORAL | 6 refills | Status: DC

## 2024-11-14 NOTE — Progress Notes (Signed)
 Generic Oracea  to [facility]

## 2024-12-04 ENCOUNTER — Encounter: Payer: BC Managed Care – PPO | Admitting: Dermatology

## 2024-12-11 ENCOUNTER — Ambulatory Visit: Admitting: Dermatology

## 2024-12-11 DIAGNOSIS — D2239 Melanocytic nevi of other parts of face: Secondary | ICD-10-CM | POA: Diagnosis not present

## 2024-12-11 DIAGNOSIS — Z85828 Personal history of other malignant neoplasm of skin: Secondary | ICD-10-CM

## 2024-12-11 DIAGNOSIS — Z1283 Encounter for screening for malignant neoplasm of skin: Secondary | ICD-10-CM | POA: Diagnosis not present

## 2024-12-11 DIAGNOSIS — D229 Melanocytic nevi, unspecified: Secondary | ICD-10-CM

## 2024-12-11 DIAGNOSIS — Z87898 Personal history of other specified conditions: Secondary | ICD-10-CM

## 2024-12-11 DIAGNOSIS — L813 Cafe au lait spots: Secondary | ICD-10-CM | POA: Diagnosis not present

## 2024-12-11 DIAGNOSIS — D225 Melanocytic nevi of trunk: Secondary | ICD-10-CM | POA: Diagnosis not present

## 2024-12-11 DIAGNOSIS — W908XXA Exposure to other nonionizing radiation, initial encounter: Secondary | ICD-10-CM

## 2024-12-11 DIAGNOSIS — D1801 Hemangioma of skin and subcutaneous tissue: Secondary | ICD-10-CM

## 2024-12-11 DIAGNOSIS — Z7189 Other specified counseling: Secondary | ICD-10-CM

## 2024-12-11 DIAGNOSIS — B070 Plantar wart: Secondary | ICD-10-CM

## 2024-12-11 DIAGNOSIS — L719 Rosacea, unspecified: Secondary | ICD-10-CM | POA: Diagnosis not present

## 2024-12-11 DIAGNOSIS — D2371 Other benign neoplasm of skin of right lower limb, including hip: Secondary | ICD-10-CM

## 2024-12-11 DIAGNOSIS — L821 Other seborrheic keratosis: Secondary | ICD-10-CM

## 2024-12-11 DIAGNOSIS — L578 Other skin changes due to chronic exposure to nonionizing radiation: Secondary | ICD-10-CM

## 2024-12-11 DIAGNOSIS — L814 Other melanin hyperpigmentation: Secondary | ICD-10-CM

## 2024-12-11 DIAGNOSIS — D239 Other benign neoplasm of skin, unspecified: Secondary | ICD-10-CM

## 2024-12-11 MED ORDER — DOXYCYCLINE MONOHYDRATE 50 MG PO CAPS: 50.0000 mg | ORAL_CAPSULE | Freq: Every day | ORAL | 11 refills | Status: AC

## 2024-12-11 NOTE — Patient Instructions (Addendum)
 Recommend using Curad Mediplast pads. Cut to fit wart or callus. Cover with Elastoplast waterproof tape or any waterproof band-aid. Change every 3 to 4 days, or sooner if necessary.  Treatment may require several months of regular use before results are seen.   Counseling for BBL / IPL / Laser and Coordination of Care Discussed the treatment option of Broad Band Light (BBL) /Intense Pulsed Light (IPL)/ Laser for skin discoloration, including brown spots and redness.  Typically we recommend at least 1-3 treatment sessions about 5-8 weeks apart for best results.  Cannot have tanned skin when BBL performed, and regular use of sunscreen/photoprotection is advised after the procedure to help maintain results. The patient's condition may also require maintenance treatments in the future.  The fee for BBL / laser treatments is $350 per treatment session for the whole face (prices may increase in Jan 2026).  A fee can be quoted for other parts of the body.  Insurance typically does not pay for BBL/laser treatments and therefore the fee is an out-of-pocket cost. Recommend prophylactic valtrex treatment. Once scheduled for procedure, will send Rx in prior to patient's appointment.   Rosacea  What is rosacea? Rosacea (say: ro-zay-sha) is a common skin disease that usually begins as a trend of flushing or blushing easily.  As rosacea progresses, a persistent redness in the center of the face will develop and may gradually spread beyond the nose and cheeks to the forehead and chin.  In some cases, the ears, chest, and back could be affected.  Rosacea may appear as tiny blood vessels or small red bumps that occur in crops.  Frequently they can contain pus, and are called pustules.  If the bumps do not contain pus, they are referred to as papules.  Rarely, in prolonged, untreated cases of rosacea, the oil glands of the nose and cheeks may become permanently enlarged.  This is called rhinophyma, and is seen more  frequently in men.  Signs and Risks In its beginning stages, rosacea tends to come and go, which makes it difficult to recognize.  It can start as intermittent flushing of the face.  Eventually, blood vessels may become permanently visible.  Pustules and papules can appear, but can be mistaken for adult acne.  People of all races, ages, genders and ethnic groups are at risk of developing rosacea.  However, it is more common in women (especially around menopause) and adults with fair skin between the ages of 37 and 9.  Treatment Dermatologists typically recommend a combination of treatments to effectively manage rosacea.  Treatment can improve symptoms and may stop the progression of the rosacea.  Treatment may involve both topical and oral medications.  The tetracycline antibiotics are often used for their anti-inflammatory effect; however, because of the possibility of developing antibiotic resistance, they should not be used long term at full dose.  For dilated blood vessels the options include electrodessication (uses electric current through a small needle), laser treatment, and cosmetics to hide the redness.   With all forms of treatment, improvement is a slow process, and patients may not see any results for the first 3-4 weeks.  It is very important to avoid the sun and other triggers.  Patients must wear sunscreen daily.  Skin Care Instructions: Cleanse the skin with a mild soap such as CeraVe cleanser, Cetaphil cleanser, or Dove soap once or twice daily as needed. Moisturize with Eucerin Redness Relief Daily Perfecting Lotion (has a subtle green tint), CeraVe Moisturizing Cream, or Oil  of Olay Daily Moisturizer with sunscreen every morning and/or night as recommended. Makeup should be non-comedogenic (wont clog pores) and be labeled for sensitive skin. Good choices for cosmetics are: Neutrogena, Almay, and Physicians Formula.  Any product with a green tint tends to offset a red  complexion. If your eyes are dry and irritated, use artificial tears 2-3 times per day and cleanse the eyelids daily with baby shampoo.  Have your eyes examined at least every 2 years.  Be sure to tell your eye doctor that you have rosacea. Alcoholic beverages tend to cause flushing of the skin, and may make rosacea worse. Always wear sunscreen, protect your skin from extreme hot and cold temperatures, and avoid spicy foods, hot drinks, and mechanical irritation such as rubbing, scrubbing, or massaging the face.  Avoid harsh skin cleansers, cleansing masks, astringents, and exfoliation. If a particular product burns or makes your face feel tight, then it is likely to flare your rosacea. If you are having difficulty finding a sunscreen that you can tolerate, you may try switching to a chemical-free sunscreen.  These are ones whose active ingredient is zinc oxide or titanium dioxide only.  They should also be fragrance free, non-comedogenic, and labeled for sensitive skin. Rosacea triggers may vary from person to person.  There are a variety of foods that have been reported to trigger rosacea.  Some patients find that keeping a diary of what they were doing when they flared helps them avoid triggers.   Due to recent changes in healthcare laws, you may see results of your pathology and/or laboratory studies on MyChart before the doctors have had a chance to review them. We understand that in some cases there may be results that are confusing or concerning to you. Please understand that not all results are received at the same time and often the doctors may need to interpret multiple results in order to provide you with the best plan of care or course of treatment. Therefore, we ask that you please give us  2 business days to thoroughly review all your results before contacting the office for clarification. Should we see a critical lab result, you will be contacted sooner.   If You Need Anything After Your  Visit  If you have any questions or concerns for your doctor, please call our main line at [phone] and press option 4 to reach your doctor's medical assistant. If no one answers, please leave a voicemail as directed and we will return your call as soon as possible. Messages left after 4 pm will be answered the following business day.   You may also send us  a message via MyChart. We typically respond to MyChart messages within 1-2 business days.  For prescription refills, please ask your pharmacy to contact our office. Our fax number is [phone].  If you have an urgent issue when the clinic is closed that cannot wait until the next business day, you can page your doctor at the number below.    Please note that while we do our best to be available for urgent issues outside of office hours, we are not available 24/7.   If you have an urgent issue and are unable to reach us , you may choose to seek medical care at your doctor's office, retail clinic, urgent care center, or emergency room.  If you have a medical emergency, please immediately call 911 or go to the emergency department.  Pager Numbers  - Dr. Hester: [phone]  - Dr. Jackquline: [phone]  -  Dr. Claudene: [phone]   - Dr. Raymund: [phone]  In the event of inclement weather, please call our main line at [phone] for an update on the status of any delays or closures.  Dermatology Medication Tips: Please keep the boxes that topical medications come in in order to help keep track of the instructions about where and how to use these. Pharmacies typically print the medication instructions only on the boxes and not directly on the medication tubes.   If your medication is too expensive, please contact our office at [phone] option 4 or send us  a message through MyChart.   We are unable to tell what your co-pay for medications will be in advance as this is different depending on your insurance coverage.  However, we may be able to find a substitute medication at lower cost or fill out paperwork to get insurance to cover a needed medication.   If a prior authorization is required to get your medication covered by your insurance company, please allow us  1-2 business days to complete this process.  Drug prices often vary depending on where the prescription is filled and some pharmacies may offer cheaper prices.  The website www.goodrx.com contains coupons for medications through different pharmacies. The prices here do not account for what the cost may be with help from insurance (it may be cheaper with your insurance), but the website can give you the price if you did not use any insurance.  - You can print the associated coupon and take it with your prescription to the pharmacy.  - You may also stop by our office during regular business hours and pick up a GoodRx coupon card.  - If you need your prescription sent electronically to a different pharmacy, notify our office through [facility] or by phone at [phone] option 4.     Si Usted Necesita Algo Despus de Su Visita  Tambin puede enviarnos un mensaje a travs de Clinical Cytogeneticist. Por lo general respondemos a los mensajes de MyChart en el transcurso de 1 a 2 das hbiles.  Para renovar recetas, por favor pida a su farmacia que se ponga en contacto con nuestra oficina. Randi lakes de fax es [place] [phone].  Si tiene un asunto urgente cuando la clnica est cerrada y que no puede esperar hasta el siguiente da hbil, puede llamar/localizar a su doctor(a) al nmero que aparece a continuacin.   Por favor, tenga en cuenta que aunque hacemos todo lo posible para estar disponibles para asuntos urgentes fuera del horario de [place], no estamos disponibles las 24 horas del da, los 7 [address]  [address] de la [place].   Si tiene un problema urgente y no puede comunicarse con nosotros, puede optar por buscar atencin mdica  en el consultorio de su  doctor(a), en una clnica privada, en un centro de atencin urgente o en una sala de emergencias.  Si tiene engineer, drilling, por favor llame inmediatamente al 911 o vaya a la sala de emergencias.  Nmeros de bper  - Dr. Hester: [phone]  - Dra. Jackquline: [phone]  - Dr. Claudene: [phone]  - Dra. Kitts: [phone]  En caso de inclemencias del [place], por favor llame a nuestra lnea principal al [phone] para una actualizacin sobre el estado de cualquier retraso o cierre.  Consejos para la medicacin en dermatologa: Por favor, guarde las cajas en las que vienen los medicamentos de uso tpico para ayudarle a seguir las instrucciones sobre dnde y cmo usarlos. Las farmacias generalmente imprimen las instrucciones del  medicamento slo en las cajas y no directamente en los tubos del medicamento.   Si su medicamento es muy caro, por favor, pngase en contacto con landry rieger llamando al [phone] y presione la opcin 4 o envenos un mensaje a travs de Clinical Cytogeneticist.   No podemos decirle cul ser su copago por los medicamentos por adelantado ya que esto es diferente dependiendo de la cobertura de su seguro. Sin embargo, es posible que podamos encontrar un medicamento sustituto a audiological scientist un formulario para que el seguro cubra el medicamento que se considera necesario.   Si se requiere una autorizacin previa para que su compaa de seguros cubra su medicamento, por favor permtanos de 1 a 2 das hbiles para completar este proceso.  Los precios de los medicamentos varan con frecuencia dependiendo del environmental consultant de dnde se surte la receta y alguna farmacias pueden ofrecer precios ms baratos.  El sitio web www.goodrx.com tiene cupones para medicamentos de health and safety inspector. Los precios aqu no tienen en cuenta lo que podra costar con la ayuda del seguro (puede ser ms barato con su seguro), pero el sitio web puede darle el precio si no utiliz producer, television/film/video.  - Puede imprimir el cupn correspondiente y llevarlo con su receta a la farmacia.  - Tambin puede pasar por nuestra oficina durante el horario de atencin regular y education officer, museum una tarjeta de cupones de GoodRx.  - Si necesita que su receta se enve electrnicamente a una farmacia diferente, informe a nuestra oficina a travs de MyChart de [place] o por telfono llamando al [phone] y presione la opcin 4.

## 2024-12-11 NOTE — Progress Notes (Unsigned)
 "  Follow-Up Visit   Subjective  Rachel Wells is a 53 y.o. female who presents for the following: Skin Cancer Screening and Full Body Skin Exam  The patient presents for Total-Body Skin Exam (TBSE) for skin cancer screening and mole check. The patient has spots, moles and lesions to be evaluated, some may be new or changing. Patient with rosacea using doxycycline  40 MG daily and Ivermectin/Azelaic acid compound. She is having more redness and bumps on the face.  History of eyelid itching.    The following portions of the chart were reviewed this encounter and updated as appropriate: medications, allergies, medical history  Review of Systems:  No other skin or systemic complaints except as noted in HPI or Assessment and Plan.  Objective  Well appearing patient in no apparent distress; mood and affect are within normal limits.  A full examination was performed including scalp, head, eyes, ears, nose, lips, neck, chest, axillae, abdomen, back, buttocks, bilateral upper extremities, bilateral lower extremities, hands, feet, fingers, toes, fingernails, and toenails. All findings within normal limits unless otherwise noted below.   Relevant physical exam findings are noted in the Assessment and Plan.    Assessment & Plan   SKIN CANCER SCREENING PERFORMED TODAY.  ACTINIC DAMAGE - Chronic condition, secondary to cumulative UV/sun exposure - diffuse scaly erythematous macules with underlying dyspigmentation - Recommend daily broad spectrum sunscreen SPF 30+ to sun-exposed areas, reapply every 2 hours as needed.  - Staying in the shade or wearing long sleeves, sun glasses (UVA+UVB protection) and wide brim hats (4-inch brim around the entire circumference of the hat) are also recommended for sun protection.  - Call for new or changing lesions.  LENTIGINES, SEBORRHEIC KERATOSES, HEMANGIOMAS - Benign normal skin lesions - Benign-appearing - Call for any changes  MELANOCYTIC NEVI -  Tan-brown and/or pink-flesh-colored symmetric macules and papules - 3.0 mm flesh brown papule at right zygoma - 6 mm speckled brown macule left upper back - Left upper breast with 2 mm flesh papule with inferior tan macule at edge  - Benign appearing on exam today - Observation - Call clinic for new or changing moles - Recommend daily use of broad spectrum spf 30+ sunscreen to sun-exposed areas.   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  - Call if any new or changing lesions are noted between office visits   ROSACEA Exam Mid face erythema with telangiectasias    Chronic and persistent condition with duration or expected duration over one year. Condition is improving with treatment but not currently at goal.     Rosacea is a chronic progressive skin condition usually affecting the face of adults, causing redness and/or acne bumps. It is treatable but not curable. It sometimes affects the eyes (ocular rosacea) as well. It may respond to topical and/or systemic medication and  can flare with stress, sun exposure, alcohol, exercise, topical steroids (including hydrocortisone/cortisone 10) and some foods.  Daily application of broad spectrum spf 30+ sunscreen to face is recommended to reduce flares.   Patient with grittiness of the eyes, not  related to rosacea per ophthalmologist. Continue otc eye drops as prescribed by eye dr.    Treatment Plan Switch to doxycycline  monohydrate 50 MG daily with food dsp #30 1 yr Rf.   Continue Ivermectin/Azelaic acid compound qd. Original Rx prescribed by Dr. Dela. Patient will call for refills.     Discussed adapalene 0.3% gel, Sal Acid cleansers to help minimize pores.  Doxycycline  should be taken with food to prevent nausea. Do not lay down for 30 minutes after taking. Be cautious with sun exposure and use good sun protection while on this medication. Pregnant women should not take this medication.    Counseling for BBL / IPL / Laser and Coordination of Care Discussed the treatment option of Broad Band Light (BBL) /Intense Pulsed Light (IPL)/ Laser for skin discoloration, including brown spots and redness.  Typically we recommend at least 1-3 treatment sessions about 5-8 weeks apart for best results.  Cannot have tanned skin when BBL performed, and regular use of sunscreen/photoprotection is advised after the procedure to help maintain results. The patient's condition may also require maintenance treatments in the future.  The fee for BBL / laser treatments is $350 per treatment session for the whole face.  A fee can be quoted for other parts of the body.  Insurance typically does not pay for BBL/laser treatments and therefore the fee is an out-of-pocket cost. Recommend prophylactic valtrex treatment. Once scheduled for procedure, will send Rx in prior to patient's appointment.   DERMATOFIBROMA Exam: Firm pink/brown papulenodule with dimple sign right upper thigh.  Treatment Plan: A dermatofibroma is a benign growth possibly related to trauma, such as an insect bite, cut from shaving, or inflamed acne-type bump.  Treatment options to remove include shave or excision with resulting scar and risk of recurrence.  Since benign-appearing and not bothersome, will observe for now.   PLANTAR  WART Exam: R plantar foot at ball firm verrucous papule   Counseling Discussed viral / HPV (Human Papilloma Virus) etiology and risk of spread /infectivity to other areas of body as well as to other people.  Multiple treatments and methods may be required to clear warts and it is possible treatment may not be successful.  Treatment risks include discoloration; scarring and there is still potential for wart recurrence.     Recommend using Curad Mediplast pads. Cut to fit wart or callus. Cover with Elastoplast waterproof tape or any waterproof band-aid. Change every 3 to 4 days, or sooner if necessary.  Treatment may require several months of regular use before results are seen.   Return in about 1 year (around 12/11/2025) for TBSE, Hx Dysplastic Nevus, Hx BCC.  IAndrea Kerns, CMA, am acting as scribe for Rexene Rattler, MD .   Documentation: I have reviewed the above documentation for accuracy and completeness, and I agree with the above.  Rexene Rattler, MD    "

## 2024-12-31 ENCOUNTER — Other Ambulatory Visit: Payer: Self-pay | Admitting: Internal Medicine

## 2024-12-31 DIAGNOSIS — R0602 Shortness of breath: Secondary | ICD-10-CM

## 2024-12-31 DIAGNOSIS — I2089 Other forms of angina pectoris: Secondary | ICD-10-CM

## 2025-01-17 ENCOUNTER — Ambulatory Visit
Admission: RE | Admit: 2025-01-17 | Discharge: 2025-01-17 | Disposition: A | Payer: Self-pay | Source: Ambulatory Visit | Attending: Internal Medicine | Admitting: Internal Medicine

## 2025-01-17 DIAGNOSIS — R0602 Shortness of breath: Secondary | ICD-10-CM

## 2025-01-17 DIAGNOSIS — I2089 Other forms of angina pectoris: Secondary | ICD-10-CM

## 2025-12-17 ENCOUNTER — Ambulatory Visit: Admitting: Dermatology
